# Patient Record
Sex: Female | Born: 1935 | Race: White | Hispanic: No | Smoking: Former smoker
Health system: Southern US, Community
[De-identification: ages and names within clinical notes are randomized; demographics above are authoritative.]

## PROBLEM LIST (undated history)

## (undated) DIAGNOSIS — Z8489 Family history of other specified conditions: Secondary | ICD-10-CM

## (undated) DIAGNOSIS — M1990 Unspecified osteoarthritis, unspecified site: Secondary | ICD-10-CM

## (undated) DIAGNOSIS — M810 Age-related osteoporosis without current pathological fracture: Secondary | ICD-10-CM

## (undated) DIAGNOSIS — G4730 Sleep apnea, unspecified: Secondary | ICD-10-CM

## (undated) DIAGNOSIS — F32A Depression, unspecified: Secondary | ICD-10-CM

## (undated) DIAGNOSIS — N390 Urinary tract infection, site not specified: Secondary | ICD-10-CM

## (undated) DIAGNOSIS — E039 Hypothyroidism, unspecified: Secondary | ICD-10-CM

## (undated) DIAGNOSIS — N183 Chronic kidney disease, stage 3 unspecified: Secondary | ICD-10-CM

## (undated) DIAGNOSIS — F329 Major depressive disorder, single episode, unspecified: Secondary | ICD-10-CM

## (undated) DIAGNOSIS — M549 Dorsalgia, unspecified: Secondary | ICD-10-CM

## (undated) DIAGNOSIS — G8929 Other chronic pain: Secondary | ICD-10-CM

## (undated) DIAGNOSIS — C50911 Malignant neoplasm of unspecified site of right female breast: Secondary | ICD-10-CM

## (undated) DIAGNOSIS — G43909 Migraine, unspecified, not intractable, without status migrainosus: Secondary | ICD-10-CM

## (undated) DIAGNOSIS — M797 Fibromyalgia: Secondary | ICD-10-CM

## (undated) HISTORY — PX: DILATION AND CURETTAGE OF UTERUS: SHX78

## (undated) HISTORY — PX: CHOLECYSTECTOMY OPEN: SUR202

## (undated) HISTORY — PX: BREAST BIOPSY: SHX20

## (undated) HISTORY — PX: TONSILLECTOMY: SUR1361

## (undated) HISTORY — PX: HIP FRACTURE SURGERY: SHX118

## (undated) HISTORY — PX: CATARACT EXTRACTION W/ INTRAOCULAR LENS  IMPLANT, BILATERAL: SHX1307

## (undated) HISTORY — PX: APPENDECTOMY: SHX54

## (undated) HISTORY — PX: JOINT REPLACEMENT: SHX530

## (undated) HISTORY — PX: TOTAL KNEE ARTHROPLASTY: SHX125

## (undated) HISTORY — PX: BREAST LUMPECTOMY: SHX2

---

## 2016-05-07 HISTORY — PX: LACERATION REPAIR: SHX5168

## 2016-10-21 ENCOUNTER — Encounter (HOSPITAL_COMMUNITY): Payer: Self-pay | Admitting: Emergency Medicine

## 2016-10-21 ENCOUNTER — Inpatient Hospital Stay (HOSPITAL_COMMUNITY)
Admission: EM | Admit: 2016-10-21 | Discharge: 2016-10-26 | DRG: 536 | Disposition: A | Discharge status: Skilled Nursing Facility | Payer: Medicare Other | Attending: Internal Medicine | Admitting: Internal Medicine

## 2016-10-21 DIAGNOSIS — S32592A Other specified fracture of left pubis, initial encounter for closed fracture: Principal | ICD-10-CM | POA: Diagnosis present

## 2016-10-21 DIAGNOSIS — R300 Dysuria: Secondary | ICD-10-CM | POA: Diagnosis present

## 2016-10-21 DIAGNOSIS — R296 Repeated falls: Secondary | ICD-10-CM | POA: Diagnosis present

## 2016-10-21 DIAGNOSIS — W010XXA Fall on same level from slipping, tripping and stumbling without subsequent striking against object, initial encounter: Secondary | ICD-10-CM | POA: Diagnosis present

## 2016-10-21 DIAGNOSIS — F329 Major depressive disorder, single episode, unspecified: Secondary | ICD-10-CM | POA: Diagnosis present

## 2016-10-21 DIAGNOSIS — Z853 Personal history of malignant neoplasm of breast: Secondary | ICD-10-CM

## 2016-10-21 DIAGNOSIS — M25552 Pain in left hip: Secondary | ICD-10-CM | POA: Diagnosis not present

## 2016-10-21 DIAGNOSIS — K5900 Constipation, unspecified: Secondary | ICD-10-CM | POA: Diagnosis present

## 2016-10-21 DIAGNOSIS — N183 Chronic kidney disease, stage 3 unspecified: Secondary | ICD-10-CM | POA: Diagnosis present

## 2016-10-21 DIAGNOSIS — M810 Age-related osteoporosis without current pathological fracture: Secondary | ICD-10-CM | POA: Diagnosis present

## 2016-10-21 DIAGNOSIS — S32509A Unspecified fracture of unspecified pubis, initial encounter for closed fracture: Secondary | ICD-10-CM | POA: Diagnosis present

## 2016-10-21 DIAGNOSIS — Z880 Allergy status to penicillin: Secondary | ICD-10-CM

## 2016-10-21 DIAGNOSIS — Z87440 Personal history of urinary (tract) infections: Secondary | ICD-10-CM

## 2016-10-21 DIAGNOSIS — F39 Unspecified mood [affective] disorder: Secondary | ICD-10-CM | POA: Diagnosis present

## 2016-10-21 DIAGNOSIS — E039 Hypothyroidism, unspecified: Secondary | ICD-10-CM | POA: Diagnosis present

## 2016-10-21 DIAGNOSIS — R509 Fever, unspecified: Secondary | ICD-10-CM | POA: Diagnosis present

## 2016-10-21 DIAGNOSIS — Z79899 Other long term (current) drug therapy: Secondary | ICD-10-CM

## 2016-10-21 HISTORY — DX: Other chronic pain: G89.29

## 2016-10-21 HISTORY — DX: Chronic kidney disease, stage 3 unspecified: N18.30

## 2016-10-21 HISTORY — DX: Migraine, unspecified, not intractable, without status migrainosus: G43.909

## 2016-10-21 HISTORY — DX: Dorsalgia, unspecified: M54.9

## 2016-10-21 HISTORY — DX: Malignant neoplasm of unspecified site of right female breast: C50.911

## 2016-10-21 HISTORY — DX: Chronic kidney disease, stage 3 (moderate): N18.3

## 2016-10-21 HISTORY — DX: Sleep apnea, unspecified: G47.30

## 2016-10-21 HISTORY — DX: Unspecified osteoarthritis, unspecified site: M19.90

## 2016-10-21 HISTORY — DX: Urinary tract infection, site not specified: N39.0

## 2016-10-21 HISTORY — DX: Hypothyroidism, unspecified: E03.9

## 2016-10-21 HISTORY — DX: Family history of other specified conditions: Z84.89

## 2016-10-21 HISTORY — DX: Fibromyalgia: M79.7

## 2016-10-21 HISTORY — DX: Depression, unspecified: F32.A

## 2016-10-21 HISTORY — DX: Major depressive disorder, single episode, unspecified: F32.9

## 2016-10-21 HISTORY — DX: Age-related osteoporosis without current pathological fracture: M81.0

## 2016-10-21 NOTE — ED Provider Notes (Signed)
[facility] DEPT Provider Note   CSN: [id number] Arrival date & time: 10/21/16  2340  By signing my name below, I, Oleh Genin, attest that this documentation has been prepared under the direction and in the presence of Orpah Greek, MD. Electronically Signed: Oleh Genin, Scribe. 10/21/16. 11:44 PM.    History   Chief Complaint Chief Complaint  Patient presents with  . Fall    HPI Rhonda Snyder is a 83 y.o. female who presents to the ED following a fall from standing that occurred this evening. This patient states that she was trying to take off a dress, lost balance, and fell backwards to the floor. She struck her head on the floor. However no LOC. At interview, the patient is reporting mild posterior head pain, posterior neck pain, and L posterior hip/"buttocks" area. The patient did have a bilateral hip "procedure" in the past. Denies any changes in vision, decreased sensation, or loss of function distally. She has no other complaints at this time.   The history is provided by the patient. No language interpreter was used.    History reviewed. No pertinent past medical history.  There are no active problems to display for this patient.   History reviewed. No pertinent surgical history.  OB History    No data available       Home Medications    Prior to Admission medications   Medication Sig Start Date End Date Taking? Authorizing Provider  acetaminophen (TYLENOL) 500 MG chewable tablet Chew 500 mg by mouth every evening.   Yes Historical Provider, MD  buPROPion (WELLBUTRIN XL) 300 MG 24 hr tablet Take 300 mg by mouth daily.   Yes Historical Provider, MD  Calcium-Magnesium-Vitamin D (CALCIUM 1200+D3 PO) Take 1 tablet by mouth 2 (two) times daily.   Yes Historical Provider, MD  escitalopram (LEXAPRO) 10 MG tablet Take 10 mg by mouth daily.   Yes Historical Provider, MD  exemestane (AROMASIN) 25 MG tablet Take 25 mg by mouth every evening.   Yes  Historical Provider, MD  HYDROcodone-acetaminophen (NORCO/VICODIN) 5-325 MG tablet Take 1 tablet by mouth every 6 (six) hours as needed for moderate pain.   Yes Historical Provider, MD  levothyroxine (SYNTHROID, LEVOTHROID) 137 MCG tablet Take 137 mcg by mouth daily before breakfast.   Yes Historical Provider, MD  Multiple Vitamin (MULTIVITAMIN WITH MINERALS) TABS tablet Take 1 tablet by mouth daily.   Yes Historical Provider, MD  nitrofurantoin (MACRODANTIN) 50 MG capsule Take 50 mg by mouth at bedtime.   Yes Historical Provider, MD    Family History History reviewed. No pertinent family history.  Social History Social History  Substance Use Topics  . Smoking status: Never Smoker  . Smokeless tobacco: Never Used  . Alcohol use 1.8 oz/week    3 Glasses of wine per week     Comment: per week     Allergies   Penicillins   Review of Systems Review of Systems  Musculoskeletal: Positive for neck pain.       L hip pain  Neurological: Positive for headaches. Negative for weakness and numbness.  All other systems reviewed and are negative.    Physical Exam Updated Vital Signs BP 145/71   Pulse 90   Temp 98.4 F (36.9 C) (Oral)   Resp 13   Ht 5\' 7"  (1.702 m)   Wt 155 lb (70.3 kg)   SpO2 96%   BMI 24.28 kg/m   Physical Exam  Constitutional: She is oriented to person, place,  and time. She appears well-developed and well-nourished. No distress.  HENT:  Head: Normocephalic.  Right Ear: Hearing normal.  Left Ear: Hearing normal.  Nose: Nose normal.  Mouth/Throat: Oropharynx is clear and moist and mucous membranes are normal.  There is a contusion to the R occipital scalp.   Eyes: Conjunctivae and EOM are normal. Pupils are equal, round, and reactive to light.  Neck: Normal range of motion. Neck supple.  There is para-cervical tenderness bilaterally.   Cardiovascular: Regular rhythm, S1 normal and S2 normal.  Exam reveals no gallop and no friction rub.   No murmur  heard. Pulmonary/Chest: Effort normal and breath sounds normal. No respiratory distress. She exhibits no tenderness.  Abdominal: Soft. Normal appearance and bowel sounds are normal. There is no hepatosplenomegaly. There is no tenderness. There is no rebound, no guarding, no tenderness at McBurney's point and negative Murphy's sign. No hernia.  Musculoskeletal:  There is decreased ROM of the L hip secondary to pain without obvious deformity.   Neurological: She is alert and oriented to person, place, and time. She has normal strength. No cranial nerve deficit or sensory deficit. Coordination normal. GCS eye subscore is 4. GCS verbal subscore is 5. GCS motor subscore is 6.  Skin: Skin is warm, dry and intact. No rash noted. No cyanosis.  Psychiatric: She has a normal mood and affect. Her speech is normal and behavior is normal. Thought content normal.  Nursing note and vitals reviewed.    ED Treatments / Results  DIAGNOSTIC STUDIES: Oxygen Saturation is 97 percent on room air which is normal by my interpretation.    COORDINATION OF CARE: 11:51 PM Discussed treatment plan with pt at bedside and pt agreed to plan.    Labs (all labs ordered are listed, but only abnormal results are displayed) Labs Reviewed  CBC WITH DIFFERENTIAL/PLATELET - Abnormal; Notable for the following:       Result Value   MCV 101.0 (*)    Neutro Abs 7.8 (*)    All other components within normal limits  BASIC METABOLIC PANEL - Abnormal; Notable for the following:    Creatinine, Ser 1.11 (*)    GFR calc non Af Amer 45 (*)    GFR calc Af Amer 52 (*)    All other components within normal limits    EKG  EKG Interpretation None       Radiology Ct Head Wo Contrast  Result Date: 10/22/2016 CLINICAL DATA:  Post unwitnessed fall today with headache. EXAM: CT HEAD WITHOUT CONTRAST CT CERVICAL SPINE WITHOUT CONTRAST TECHNIQUE: Multidetector CT imaging of the head and cervical spine was performed following the  standard protocol without intravenous contrast. Multiplanar CT image reconstructions of the cervical spine were also generated. COMPARISON:  None. FINDINGS: CT HEAD FINDINGS Brain: No evidence of acute infarction, hemorrhage, hydrocephalus, extra-axial collection or mass lesion/mass effect. Moderate generalized atrophy. Mild chronic small vessel ischemia. Vascular: No hyperdense vessel or unexpected calcification. Skull: Normal. Negative for fracture or focal lesion. Sinuses/Orbits: Bilateral cataract resection. Orbits are otherwise unremarkable. Paranasal sinuses and mastoid air cells well-aerated. Other: None. CT CERVICAL SPINE FINDINGS Alignment: Minimal degenerative-type anterolisthesis of C4 on C5. Alignment is otherwise maintained. Skull base and vertebrae: No acute fracture. No primary bone lesion or focal pathologic process. Chronic degenerative change at C1-C2 articulation. Soft tissues and spinal canal: No prevertebral fluid or swelling. No visible canal hematoma. Disc levels: Disc space narrowing and endplate spurring most prominent at C5-C6 and C6-C7. There is multifocal facet arthropathy.  Multilevel neural foraminal stenosis. Upper chest: No acute abnormality. Mild biapical pleuroparenchymal scarring. Other: Carotid vascular calcifications, left greater than right. IMPRESSION: 1. No acute intracranial abnormality or skull fracture. Generalized atrophy and chronic small vessel ischemia. 2. Degenerative change in the cervical spine without acute fracture or subluxation. Electronically Signed   By: Jeb Levering M.D.   On: 10/22/2016 00:34   Ct Cervical Spine Wo Contrast  Result Date: 10/22/2016 CLINICAL DATA:  Post unwitnessed fall today with headache. EXAM: CT HEAD WITHOUT CONTRAST CT CERVICAL SPINE WITHOUT CONTRAST TECHNIQUE: Multidetector CT imaging of the head and cervical spine was performed following the standard protocol without intravenous contrast. Multiplanar CT image reconstructions of  the cervical spine were also generated. COMPARISON:  None. FINDINGS: CT HEAD FINDINGS Brain: No evidence of acute infarction, hemorrhage, hydrocephalus, extra-axial collection or mass lesion/mass effect. Moderate generalized atrophy. Mild chronic small vessel ischemia. Vascular: No hyperdense vessel or unexpected calcification. Skull: Normal. Negative for fracture or focal lesion. Sinuses/Orbits: Bilateral cataract resection. Orbits are otherwise unremarkable. Paranasal sinuses and mastoid air cells well-aerated. Other: None. CT CERVICAL SPINE FINDINGS Alignment: Minimal degenerative-type anterolisthesis of C4 on C5. Alignment is otherwise maintained. Skull base and vertebrae: No acute fracture. No primary bone lesion or focal pathologic process. Chronic degenerative change at C1-C2 articulation. Soft tissues and spinal canal: No prevertebral fluid or swelling. No visible canal hematoma. Disc levels: Disc space narrowing and endplate spurring most prominent at C5-C6 and C6-C7. There is multifocal facet arthropathy. Multilevel neural foraminal stenosis. Upper chest: No acute abnormality. Mild biapical pleuroparenchymal scarring. Other: Carotid vascular calcifications, left greater than right. IMPRESSION: 1. No acute intracranial abnormality or skull fracture. Generalized atrophy and chronic small vessel ischemia. 2. Degenerative change in the cervical spine without acute fracture or subluxation. Electronically Signed   By: Jeb Levering M.D.   On: 10/22/2016 00:34   Dg Hip Unilat W Or Wo Pelvis 2-3 Views Left  Result Date: 10/22/2016 CLINICAL DATA:  Fall today with left hip/ groin pain. EXAM: DG HIP (WITH OR WITHOUT PELVIS) 2-3V LEFT COMPARISON:  None. FINDINGS: Intramedullary rod with proximal screw fixation of both hips. The hardware is intact. No periprosthetic fracture. Both femoral heads appear seated in the acetabulum. Cortical irregularity of the left superior pubic ramus adjacent to the pubic body.  Probable nondisplaced left inferior pubic ramus fracture. Pubic symphysis remains congruent. The bones are under mineralized. IMPRESSION: Suspect acute nondisplaced left superior and inferior pubic rami fractures. Electronically Signed   By: Jeb Levering M.D.   On: 10/22/2016 00:20    Procedures Procedures (including critical care time)  Medications Ordered in ED Medications  morphine 4 MG/ML injection 2 mg (2 mg Intravenous Incomplete 10/22/16 0354)  ondansetron (ZOFRAN) injection 4 mg (4 mg Intravenous Incomplete 10/22/16 0355)  oxyCODONE-acetaminophen (PERCOCET/ROXICET) 5-325 MG per tablet 1 tablet (1 tablet Oral Given 10/22/16 0221)     Initial Impression / Assessment and Plan / ED Course  I have reviewed the triage vital signs and the nursing notes.  Pertinent labs & imaging results that were available during my care of the patient were reviewed by me and considered in my medical decision making (see chart for details).  Clinical Course    Patient presents to the emergency department with complaints of left hip pain after a mechanical fall. Patient reports that she normally sits down to change her clothing, try to take her shirt off while standing and lost her balance. She has been experiencing pain behind her left hip  ever since the fall. X-ray of left hip reveals no evidence of hip fracture or dislocation, however, there is evidence of superior and inferior pubic rami fracture that explains her current pain. No evidence of head injury.  Patient has been administered analgesia here in the ER with only minimal improvement of her pain. She is unable to sit up in the bed, cannot ambulate at all. Will require hospitalization for further pain management and physical therapy.  Final Clinical Impressions(s) / ED Diagnoses   Final diagnoses:  Closed fracture of multiple pubic rami, left, initial encounter [facility])    New Prescriptions New Prescriptions   No medications on file  I  personally performed the services described in this documentation, which was scribed in my presence. The recorded information has been reviewed and is accurate.     Orpah Greek, MD 10/22/16 [phone]

## 2016-10-21 NOTE — ED Triage Notes (Signed)
Per EMS: Pt from EMS. Pt had an unwitnessed fall. generalized hip/sacral, left leg pain, HA. No blood thinners. Did not lose consciousness. Pt did hit her head. Neuro statues was good en-route.

## 2016-10-22 ENCOUNTER — Encounter (HOSPITAL_COMMUNITY): Payer: Self-pay | Admitting: Family Medicine

## 2016-10-22 ENCOUNTER — Emergency Department (HOSPITAL_COMMUNITY): Payer: Medicare Other

## 2016-10-22 DIAGNOSIS — S32509A Unspecified fracture of unspecified pubis, initial encounter for closed fracture: Secondary | ICD-10-CM | POA: Diagnosis not present

## 2016-10-22 DIAGNOSIS — N183 Chronic kidney disease, stage 3 unspecified: Secondary | ICD-10-CM | POA: Diagnosis present

## 2016-10-22 DIAGNOSIS — E039 Hypothyroidism, unspecified: Secondary | ICD-10-CM | POA: Diagnosis present

## 2016-10-22 DIAGNOSIS — R300 Dysuria: Secondary | ICD-10-CM | POA: Diagnosis present

## 2016-10-22 LAB — CBC WITH DIFFERENTIAL/PLATELET
BASOS ABS: 0 10*3/uL (ref 0.0–0.1)
BASOS PCT: 0 %
EOS ABS: 0.2 10*3/uL (ref 0.0–0.7)
EOS PCT: 2 %
HCT: 39.5 % (ref 36.0–46.0)
Hemoglobin: 12.9 g/dL (ref 12.0–15.0)
Lymphocytes Relative: 8 %
Lymphs Abs: 0.7 10*3/uL (ref 0.7–4.0)
MCH: 33 pg (ref 26.0–34.0)
MCHC: 32.7 g/dL (ref 30.0–36.0)
MCV: 101 fL — AB (ref 78.0–100.0)
MONO ABS: 0.3 10*3/uL (ref 0.1–1.0)
Monocytes Relative: 4 %
Neutro Abs: 7.8 10*3/uL — ABNORMAL HIGH (ref 1.7–7.7)
Neutrophils Relative %: 86 %
PLATELETS: 209 10*3/uL (ref 150–400)
RBC: 3.91 MIL/uL (ref 3.87–5.11)
RDW: 13.4 % (ref 11.5–15.5)
WBC: 9 10*3/uL (ref 4.0–10.5)

## 2016-10-22 LAB — BASIC METABOLIC PANEL
ANION GAP: 12 (ref 5–15)
BUN: 20 mg/dL (ref 6–20)
CALCIUM: 9.4 mg/dL (ref 8.9–10.3)
CO2: 25 mmol/L (ref 22–32)
Chloride: 104 mmol/L (ref 101–111)
Creatinine, Ser: 1.11 mg/dL — ABNORMAL HIGH (ref 0.44–1.00)
GFR calc Af Amer: 52 mL/min — ABNORMAL LOW (ref >60)
GFR, EST NON AFRICAN AMERICAN: 45 mL/min — AB (ref >60)
GLUCOSE: 97 mg/dL (ref 65–99)
Potassium: 3.6 mmol/L (ref 3.5–5.1)
SODIUM: 141 mmol/L (ref 135–145)

## 2016-10-22 MED ORDER — LEVOTHYROXINE SODIUM 25 MCG PO TABS
137.0000 ug | ORAL_TABLET | Freq: Every day | ORAL | Status: DC
  Administered 2016-10-22 – 2016-10-26 (×5): 137 ug via ORAL
  Filled 2016-10-22 (×5): qty 1

## 2016-10-22 MED ORDER — ONDANSETRON HCL 4 MG/2ML IJ SOLN
4.0000 mg | Freq: Once | INTRAMUSCULAR | Status: AC
  Administered 2016-10-22: 4 mg via INTRAVENOUS
  Filled 2016-10-22: qty 2

## 2016-10-22 MED ORDER — BUPROPION HCL ER (XL) 150 MG PO TB24
300.0000 mg | ORAL_TABLET | Freq: Every day | ORAL | Status: DC
  Administered 2016-10-22 – 2016-10-26 (×5): 300 mg via ORAL
  Filled 2016-10-22 (×5): qty 2

## 2016-10-22 MED ORDER — OXYCODONE-ACETAMINOPHEN 5-325 MG PO TABS
1.0000 | ORAL_TABLET | Freq: Once | ORAL | Status: AC
  Administered 2016-10-22: 1 via ORAL
  Filled 2016-10-22: qty 1

## 2016-10-22 MED ORDER — ENOXAPARIN SODIUM 40 MG/0.4ML ~~LOC~~ SOLN
40.0000 mg | SUBCUTANEOUS | Status: DC
  Administered 2016-10-22 – 2016-10-26 (×5): 40 mg via SUBCUTANEOUS
  Filled 2016-10-22 (×5): qty 0.4

## 2016-10-22 MED ORDER — ACETAMINOPHEN 500 MG PO TABS
1000.0000 mg | ORAL_TABLET | Freq: Three times a day (TID) | ORAL | Status: DC
  Administered 2016-10-22 – 2016-10-26 (×12): 1000 mg via ORAL
  Filled 2016-10-22 (×13): qty 2

## 2016-10-22 MED ORDER — ENSURE ENLIVE PO LIQD
237.0000 mL | Freq: Two times a day (BID) | ORAL | Status: DC
  Administered 2016-10-22: 237 mL via ORAL

## 2016-10-22 MED ORDER — NITROFURANTOIN MACROCRYSTAL 50 MG PO CAPS
50.0000 mg | ORAL_CAPSULE | Freq: Every day | ORAL | Status: DC
  Administered 2016-10-22 – 2016-10-23 (×2): 50 mg via ORAL
  Filled 2016-10-22 (×2): qty 1

## 2016-10-22 MED ORDER — OXYCODONE HCL 5 MG PO TABS
5.0000 mg | ORAL_TABLET | ORAL | Status: DC | PRN
  Administered 2016-10-22 – 2016-10-26 (×14): 5 mg via ORAL
  Filled 2016-10-22 (×15): qty 1

## 2016-10-22 MED ORDER — METHOCARBAMOL 750 MG PO TABS
750.0000 mg | ORAL_TABLET | Freq: Three times a day (TID) | ORAL | Status: DC
  Administered 2016-10-22 – 2016-10-23 (×3): 750 mg via ORAL
  Filled 2016-10-22 (×3): qty 1

## 2016-10-22 MED ORDER — ESCITALOPRAM OXALATE 10 MG PO TABS
10.0000 mg | ORAL_TABLET | Freq: Every day | ORAL | Status: DC
  Administered 2016-10-22 – 2016-10-26 (×5): 10 mg via ORAL
  Filled 2016-10-22 (×5): qty 1

## 2016-10-22 MED ORDER — MORPHINE SULFATE (PF) 4 MG/ML IV SOLN
2.0000 mg | Freq: Once | INTRAVENOUS | Status: AC
  Administered 2016-10-22: 2 mg via INTRAVENOUS
  Filled 2016-10-22: qty 1

## 2016-10-22 MED ORDER — MORPHINE SULFATE (PF) 4 MG/ML IV SOLN
4.0000 mg | INTRAVENOUS | Status: DC | PRN
  Administered 2016-10-22: 4 mg via INTRAVENOUS
  Filled 2016-10-22: qty 1

## 2016-10-22 MED ORDER — SENNOSIDES-DOCUSATE SODIUM 8.6-50 MG PO TABS: 1.0000 | ORAL_TABLET | Freq: Every evening | ORAL | Status: DC | PRN

## 2016-10-22 NOTE — Evaluation (Signed)
Physical Therapy Evaluation Patient Details Name: Rhonda Snyder MRN: [id number] DOB: 04/01/1931 Today's Date: 10/22/2016   History of Present Illness  Pt is a 83 y.o. female with a past medical history significant for osteoporosis, recurrent UTI, hypothyroidism, and recurrent falls who presents with fall and hip pain. Pt is visiting her daughter from [place]. Admitted for pubic fracture.   Clinical Impression  PTA, pt required assist with ADLs (bathing, cooking, cleaning) and ambulated using standard walker and rollator. Pt is from [place] but will be staying with her daughter and her daughter's wife in [place] until she is well enough to fly back home. Pt daughter works full time and her daughter's wife is caring for 4 children. Pt limited by high pain and fatigue this session (LLE pain>RLE pain). Required mod A +2 for sit<> stand but was able to take small steps to chair with +1 mod A. Good UE strength but required cues to offload BLEs using RW. Recommending SNF at this time to address deficits in mobility and to reduce caregiver burden. PT will continue to follow acutely. Called pt's daughter, [name], after session to confirm availability of standard walker and rollator at home. Reviewed PT recommendation for SNF or HHPT,HHOT, and HH Aide if SNF is not possible. [name] agreed that it would be difficult to care for pt at home as Linda's wife is also physically disabled and has to care for 4 children at home. [name] reports possibility of also hiring extra assistance at home.    Follow Up Recommendations SNF (HHPT, HHOT, [place] if insurance does not cover SNF stay)    Equipment Recommendations  Other (comment);Wheelchair (measurements PT);Wheelchair cushion (measurements PT);3in1 (PT) (18x18, antitippers, desk arms, elevating leg rests) pressure relieving cushion   Recommendations for Other Services       Precautions / Restrictions Precautions Precautions: Fall Restrictions Weight  Bearing Restrictions: No      Mobility  Bed Mobility Overal bed mobility: Needs Assistance Bed Mobility: Rolling;Sidelying to Sit Rolling: Min guard Sidelying to sit: Mod assist;+2 for physical assistance       General bed mobility comments: Pt with good UE strength to roll although painful. Requires Mod A for LE mobility and trunk upright. use of bed pad to slide pelvis. VCs for hand placement and sequencing  Transfers Overall transfer level: Needs assistance Equipment used: Rolling walker (2 wheeled) Transfers: Sit to/from [facility] Sit to Stand: Mod assist;+2 physical assistance Stand pivot transfers: Mod assist       General transfer comment: Mod A +2 to power to stand and maintain standing balance. Pt initially demonstrates significant forward lean but is able to correct with time and VCs. Pt able to take small steps to pivot to chair. Cues for use of UE to offload LEs and for trunk upright.   Ambulation/Gait                Stairs            Wheelchair Mobility    Modified Rankin (Stroke Patients Only)       Balance Overall balance assessment: Needs assistance Sitting-balance support: Bilateral upper extremity supported;Feet supported Sitting balance-Leahy Scale: Poor Sitting balance - Comments: Requires BUE support while sitting EOB   Standing balance support: Bilateral upper extremity supported;During functional activity Standing balance-Leahy Scale: Poor Standing balance comment: Reliant on RW for standing balance  Pertinent Vitals/Pain Pain Assessment: 0-10 Pain Score: 4  Pain Location: L hip Pain Descriptors / Indicators: Sharp Pain Intervention(s): Limited activity within patient's tolerance;Monitored during session;Repositioned    Home Living Family/patient expects to be discharged to:: Private residence Living Arrangements: Children (daughter; daughter's wife be present 24 hr.  with 2 children) Available Help at Discharge: Family;Available 24 hours/day Type of Home: House Home Access: Level entry     Home Layout: Multi-level;Able to live on main level with bedroom/bathroom Home Equipment: Walker - 4 wheels;Walker - standard;Shower seat;Bedside commode      Prior Function Level of Independence: Independent with assistive device(s)         Comments: assistance with bathing, cleaning, cooking     Hand Dominance   Dominant Hand: Right    Extremity/Trunk Assessment   Upper Extremity Assessment Upper Extremity Assessment: Overall WFL for tasks assessed    Lower Extremity Assessment Lower Extremity Assessment: Generalized weakness;LLE deficits/detail LLE Deficits / Details: Limited ROM and strength 2/2 high pain. Painful to touch       Communication   Communication: No difficulties  Cognition Arousal/Alertness: Lethargic Behavior During Therapy: WFL for tasks assessed/performed Overall Cognitive Status: Within Functional Limits for tasks assessed                      General Comments      Exercises General Exercises - Lower Extremity Ankle Circles/Pumps: AROM;Both;10 reps;Supine   Assessment/Plan    PT Assessment Patient needs continued PT services  PT Problem List Decreased strength;Decreased range of motion;Decreased activity tolerance;Decreased balance;Decreased mobility;Decreased knowledge of use of DME;Decreased safety awareness;Decreased knowledge of precautions;Pain          PT Treatment Interventions DME instruction;Gait training;Functional mobility training;Therapeutic activities;Therapeutic exercise;Balance training;Patient/family education    PT Goals (Current goals can be found in the Care Plan section)  Acute Rehab PT Goals Patient Stated Goal: to go home and be stronger PT Goal Formulation: With patient Time For Goal Achievement: 11/05/16 Potential to Achieve Goals: Fair    Frequency Min 5X/week   Barriers  to discharge Decreased caregiver support Pt's daughter with full time job. Daughter's wife will be caring for 4 children and also has her own physical disabilities    Co-evaluation               End of Session Equipment Utilized During Treatment: Gait belt Activity Tolerance: Patient tolerated treatment well Patient left: in chair;with call bell/phone within reach Nurse Communication: Mobility status    Functional Assessment Tool Used: clinical judgment Functional Limitation: Mobility: Walking and moving around Mobility: Walking and Moving Around Current Status [id number]): At least 40 percent but less than 60 percent impaired, limited or restricted Mobility: Walking and Moving Around Goal Status [phone]): At least 1 percent but less than 20 percent impaired, limited or restricted    Time: 1013-1056 PT Time Calculation (min) (ACUTE ONLY): 43 min   Charges:   PT Evaluation $PT Eval Moderate Complexity: 1 Procedure PT Treatments $Therapeutic Activity: 8-22 mins $Self Care/Home Management: 8-22   PT G Codes:   PT G-Codes **NOT FOR INPATIENT CLASS** Functional Assessment Tool Used: clinical judgment Functional Limitation: Mobility: Walking and moving around Mobility: Walking and Moving Around Current Status [id number]): At least 40 percent but less than 60 percent impaired, limited or restricted Mobility: Walking and Moving Around Goal Status [phone]): At least 1 percent but less than 20 percent impaired, limited or restricted    Tonia Brooms 10/22/2016, 1:03 PM Tonia Brooms,  SPT [phone]

## 2016-10-22 NOTE — ED Notes (Signed)
Georgina Snell, RN tried an IV once and Event organiser, RN tried twice with no success. IV team consult placed

## 2016-10-22 NOTE — Progress Notes (Signed)
[facility] Interval Note   Patient seen and examined - Patient admitted after midnight, see H&P for full details  Non displace hip fracture of the pubic ramus - c/o pain in the hip, no other concern  Pain control, PT/OT - recommended SNF  Will add Robaxin, will consult SW for placement   Chipper Oman, MD

## 2016-10-22 NOTE — ED Notes (Signed)
Izora Gala, RN on 6N took report, but bed is not ready yet. Izora Gala states that next shift will call the ED when the bed is ready.

## 2016-10-22 NOTE — ED Notes (Signed)
Pt put on 2L of O2 for a saturation of 90-91 consistently. Pt is comfortable and mental status is still WNL.

## 2016-10-22 NOTE — H&P (Signed)
History and Physical  Patient Name: Rhonda Snyder     [id number]    DOB: 01-03-1931    DOA: 10/21/2016 PCP: No PCP Per Patient   Patient coming from: Daughter's home  Chief Complaint: Fall, hip pain  HPI: Rhonda Snyder is a 83 y.o. female with a past medical history significant for osteoporosis, recurrent UTI, hypothyroidism, and recurrent falls who presents with fall and hip pain.  The patient is visiting her daughter from [place] and was supposed to fly back today (Tuesday). She was in her usual state of health until tonight, when she was changing for bed, had her shirt over her head, lost her balance and fell. There was no loss of consciousness, dizziness, chest pressure, palpitations, or presyncope.  Afterwards, she had some left hip and pelvis pain and had difficulty walking, so her daughter brought her to the ER.  ED course: -Afebrile, heart rate 88, respirations and pulse ox normal, blood pressure 152/74 -Na 141, K 3.6, Cr 1.11 (baseline 1.1), WBC 9K, Hgb 12.9 and macrocytic -Radiograph of the left hip showed possible pubic rami fractures, nondisplaced line -CT of head and C-spine were unremarkable -She was given oral opioid without any improvement in her pain, followed by IV morphine with moderate improvement in pain, but was still unable to walk, so TRH were asked to observe for PT eval   The patient lives in [place], alone, has an aide who comes 5 nights a week, and a housekeeper. She has osteoporosis, and numerous falls resulting in fractures (fractured her left hip in 2015, her right hip in 2017). Her most recent fall was in June of last year, requiring 3 day stay.  Of note, she also describes new onset of dysuria and low back pain, typical or her urinary tract infections.    ROS: Review of Systems  Genitourinary: Positive for dysuria.  Musculoskeletal: Positive for falls and joint pain.  All other systems reviewed and are negative.         Past Medical  History:  Diagnosis Date  . Breast cancer ([place])   . Chronic kidney disease, stage 3   . Depression   . Hypothyroidism   . Osteoporosis   . Recurrent UTI     Past Surgical History:  Procedure Laterality Date  . CHOLECYSTECTOMY    . ORIF HIP FRACTURE    . TOTAL KNEE ARTHROPLASTY      Social History: Patient lives alone.  The patient walks unassisted or with a walker.  She is from [place], [place].  She worked as a Education officer, museum.  Her daughter lives in [place], she has two other children.    Allergies  Allergen Reactions  . Penicillins Rash    Family history: family history includes Chronic bronchitis in her mother; Liver cancer in her father.  Prior to Admission medications   Medication Sig Start Date End Date Taking? Authorizing Provider  acetaminophen (TYLENOL) 500 MG chewable tablet Chew 500 mg by mouth every evening.   Yes Historical Provider, MD  buPROPion (WELLBUTRIN XL) 300 MG 24 hr tablet Take 300 mg by mouth daily.   Yes Historical Provider, MD  Calcium-Magnesium-Vitamin D (CALCIUM 1200+D3 PO) Take 1 tablet by mouth 2 (two) times daily.   Yes Historical Provider, MD  escitalopram (LEXAPRO) 10 MG tablet Take 10 mg by mouth daily.   Yes Historical Provider, MD  exemestane (AROMASIN) 25 MG tablet Take 25 mg by mouth every evening.   Yes Historical Provider, MD  HYDROcodone-acetaminophen (NORCO/VICODIN) 5-325 MG tablet  Take 1 tablet by mouth every 6 (six) hours as needed for moderate pain.   Yes Historical Provider, MD  levothyroxine (SYNTHROID, LEVOTHROID) 137 MCG tablet Take 137 mcg by mouth daily before breakfast.   Yes Historical Provider, MD  Multiple Vitamin (MULTIVITAMIN WITH MINERALS) TABS tablet Take 1 tablet by mouth daily.   Yes Historical Provider, MD  nitrofurantoin (MACRODANTIN) 50 MG capsule Take 50 mg by mouth at bedtime.   Yes Historical Provider, MD       Physical Exam: BP 109/62   Pulse 90   Temp 98.4 F (36.9 C) (Oral)   Resp 15   Ht 5\' 7"   (1.702 m)   Wt 70.3 kg (155 lb)   SpO2 96%   BMI 24.28 kg/m  General appearance: Thin elderly adult female, alert and in no acute distress.   Eyes: Anicteric, conjunctiva pink, lids and lashes normal. PERRL.    ENT: No nasal deformity, discharge, epistaxis.  Hearing normal. OP moist without lesions.   Neck: No neck masses.  Trachea midline.  No thyromegaly/tenderness. Lymph: No cervical or supraclavicular lymphadenopathy. Skin: Warm and dry.  No jaundice.  No suspicious rashes or lesions. Cardiac: RRR, nl S1-S2, no murmurs appreciated.  Capillary refill is brisk.  JVP normal.  No LE edema.  Radial and DP pulses 2+ and symmetric. Respiratory: Normal respiratory rate and rhythm.  CTAB without rales or wheezes. Abdomen: Abdomen soft.  No TTP. No ascites, distension, hepatosplenomegaly.   MSK: No deformities or effusions.  No cyanosis or clubbing.  No eversion of left leg.  Neuro: Cranial nerves 3-12 grossly intact.  Sensation intact to light touch. Speech is fluent.  Muscle strength 5/5 in upper extremities bilaterally, and right leg.  Left leg flexion is limited by pain.    Psych: Sensorium intact and responding to questions, attention normal.  Behavior appropriate.  Affect normal.  Judgment and insight appear normal.     Labs on Admission:  I have personally reviewed following labs and imaging studies: CBC:  Recent Labs Lab 10/22/16 0042  WBC 9.0  NEUTROABS 7.8*  HGB 12.9  HCT 39.5  MCV 101.0*  PLT [id number]   Basic Metabolic Panel:  Recent Labs Lab 10/22/16 0042  NA 141  K 3.6  CL 104  CO2 25  GLUCOSE 97  BUN 20  CREATININE 1.11*  CALCIUM 9.4   GFR: Estimated Creatinine Clearance: 37.3 mL/min (by C-G formula based on SCr of 1.11 mg/dL (H)).  Liver Function Tests: No results for input(s): AST, ALT, ALKPHOS, BILITOT, PROT, ALBUMIN in the last 168 hours. No results for input(s): LIPASE, AMYLASE in the last 168 hours. No results for input(s): AMMONIA in the last 168  hours. Coagulation Profile: No results for input(s): INR, PROTIME in the last 168 hours. Cardiac Enzymes: No results for input(s): CKTOTAL, CKMB, CKMBINDEX, TROPONINI in the last 168 hours. BNP (last 3 results) No results for input(s): PROBNP in the last 8760 hours. HbA1C: No results for input(s): HGBA1C in the last 72 hours. CBG: No results for input(s): GLUCAP in the last 168 hours. Lipid Profile: No results for input(s): CHOL, HDL, LDLCALC, TRIG, CHOLHDL, LDLDIRECT in the last 72 hours. Thyroid Function Tests: No results for input(s): TSH, T4TOTAL, FREET4, T3FREE, THYROIDAB in the last 72 hours. Anemia Panel: No results for input(s): VITAMINB12, FOLATE, FERRITIN, TIBC, IRON, RETICCTPCT in the last 72 hours. Sepsis Labs: Invalid input(s): PROCALCITONIN, LACTICIDVEN No results found for this or any previous visit (from the past 240 hour(s)).  Radiological Exams on Admission: Personally reviewed CT and radiograph reports: Ct Head Wo Contrast  Result Date: 10/22/2016 CLINICAL DATA:  Post unwitnessed fall today with headache. EXAM: CT HEAD WITHOUT CONTRAST CT CERVICAL SPINE WITHOUT CONTRAST TECHNIQUE: Multidetector CT imaging of the head and cervical spine was performed following the standard protocol without intravenous contrast. Multiplanar CT image reconstructions of the cervical spine were also generated. COMPARISON:  None. FINDINGS: CT HEAD FINDINGS Brain: No evidence of acute infarction, hemorrhage, hydrocephalus, extra-axial collection or mass lesion/mass effect. Moderate generalized atrophy. Mild chronic small vessel ischemia. Vascular: No hyperdense vessel or unexpected calcification. Skull: Normal. Negative for fracture or focal lesion. Sinuses/Orbits: Bilateral cataract resection. Orbits are otherwise unremarkable. Paranasal sinuses and mastoid air cells well-aerated. Other: None. CT CERVICAL SPINE FINDINGS Alignment: Minimal degenerative-type anterolisthesis of C4 on C5.  Alignment is otherwise maintained. Skull base and vertebrae: No acute fracture. No primary bone lesion or focal pathologic process. Chronic degenerative change at C1-C2 articulation. Soft tissues and spinal canal: No prevertebral fluid or swelling. No visible canal hematoma. Disc levels: Disc space narrowing and endplate spurring most prominent at C5-C6 and C6-C7. There is multifocal facet arthropathy. Multilevel neural foraminal stenosis. Upper chest: No acute abnormality. Mild biapical pleuroparenchymal scarring. Other: Carotid vascular calcifications, left greater than right. IMPRESSION: 1. No acute intracranial abnormality or skull fracture. Generalized atrophy and chronic small vessel ischemia. 2. Degenerative change in the cervical spine without acute fracture or subluxation. Electronically Signed   By: Jeb Levering M.D.   On: 10/22/2016 00:34   Ct Cervical Spine Wo Contrast  Result Date: 10/22/2016 CLINICAL DATA:  Post unwitnessed fall today with headache. EXAM: CT HEAD WITHOUT CONTRAST CT CERVICAL SPINE WITHOUT CONTRAST TECHNIQUE: Multidetector CT imaging of the head and cervical spine was performed following the standard protocol without intravenous contrast. Multiplanar CT image reconstructions of the cervical spine were also generated. COMPARISON:  None. FINDINGS: CT HEAD FINDINGS Brain: No evidence of acute infarction, hemorrhage, hydrocephalus, extra-axial collection or mass lesion/mass effect. Moderate generalized atrophy. Mild chronic small vessel ischemia. Vascular: No hyperdense vessel or unexpected calcification. Skull: Normal. Negative for fracture or focal lesion. Sinuses/Orbits: Bilateral cataract resection. Orbits are otherwise unremarkable. Paranasal sinuses and mastoid air cells well-aerated. Other: None. CT CERVICAL SPINE FINDINGS Alignment: Minimal degenerative-type anterolisthesis of C4 on C5. Alignment is otherwise maintained. Skull base and vertebrae: No acute fracture. No  primary bone lesion or focal pathologic process. Chronic degenerative change at C1-C2 articulation. Soft tissues and spinal canal: No prevertebral fluid or swelling. No visible canal hematoma. Disc levels: Disc space narrowing and endplate spurring most prominent at C5-C6 and C6-C7. There is multifocal facet arthropathy. Multilevel neural foraminal stenosis. Upper chest: No acute abnormality. Mild biapical pleuroparenchymal scarring. Other: Carotid vascular calcifications, left greater than right. IMPRESSION: 1. No acute intracranial abnormality or skull fracture. Generalized atrophy and chronic small vessel ischemia. 2. Degenerative change in the cervical spine without acute fracture or subluxation. Electronically Signed   By: Jeb Levering M.D.   On: 10/22/2016 00:34   Dg Hip Unilat W Or Wo Pelvis 2-3 Views Left  Result Date: 10/22/2016 CLINICAL DATA:  Fall today with left hip/ groin pain. EXAM: DG HIP (WITH OR WITHOUT PELVIS) 2-3V LEFT COMPARISON:  None. FINDINGS: Intramedullary rod with proximal screw fixation of both hips. The hardware is intact. No periprosthetic fracture. Both femoral heads appear seated in the acetabulum. Cortical irregularity of the left superior pubic ramus adjacent to the pubic body. Probable nondisplaced left inferior pubic ramus fracture.  Pubic symphysis remains congruent. The bones are under mineralized. IMPRESSION: Suspect acute nondisplaced left superior and inferior pubic rami fractures. Electronically Signed   By: Jeb Levering M.D.   On: 10/22/2016 00:20        Assessment/Plan  1. Pelvic fracture:  -Conservative management with pain control and PT -Morphine 4 mg IV when necessary today -Oxycodone 5 mg when necessary when initial pain control achieved -Bowel regimen -PT eval -Consult CM for HH     2. Recurrent UTIs and dysuria:  Patient endorses new dysuria. -Continue nitrofurantoin -Collect urinalysis and urine culture -Defer additional antibiotics  until culture results  3. Hypothyroidism:  -Continue levothyroxine  4. Mood disorder:  -Continue Wellbutrin and Lexapro  5. Chronic kidney disease:  At baseline 0.9-1.2.           DVT prophylaxis: Lovenox  Code Status: FULL  Family Communication: Daughter at bedside  Disposition Plan: Anticipate PT eval and pain control.   Consults called: None Admission status: OBS At the point of initial evaluation, it is my clinical opinion that admission for OBSERVATION is reasonable and necessary because the patient's presenting complaints in the context of their chronic conditions represent sufficient risk of deterioration or significant morbidity to constitute reasonable grounds for close observation in the hospital setting, but that the patient may be medically stable for discharge from the hospital within 24 to 48 hours.    Medical decision making: Patient seen at 5:15 AM on 10/22/2016.  The patient was discussed with Dr Betsey Holiday.  What exists of the patient's chart was reviewed in depth, outside records were obtained and summarized above.  Clinical condition: stable.        Edwin Dada [facility] Pager [phone]

## 2016-10-22 NOTE — ED Notes (Signed)
Pt wants to wait to get upstairs to allow staff to get urine

## 2016-10-22 NOTE — Clinical Social Work Note (Signed)
CSW consulted for New SNF. Pt is Medicare and under OBS, therefore does not qualify for SNF. RNCM is following for discharge planning with home health services. CSW is signing off as no further needs identified. Please reconsult if a need arises prior to discharge.   Darden Dates, MSW, LCSW  Clinical Social Worker  [phone]

## 2016-10-22 NOTE — ED Notes (Signed)
Daughter has concerns about pt ambulating. Pt says she does not feel like she can walk at this moment. Daughter states she has concerns about mother hallucinating while on morphine for extended amount of time

## 2016-10-22 NOTE — Evaluation (Signed)
Occupational Therapy Evaluation Patient Details Name: Rhonda Snyder MRN: [id number] DOB: 07-06-1931 Today's Date: 10/22/2016    History of Present Illness Pt is a 83 y.o. female with a past medical history significant for osteoporosis, recurrent UTI, hypothyroidism, and recurrent falls who presents with fall and hip pain. Pt is visiting her daughter from [place]. Admitted for pubic fracture.    Clinical Impression   Pt is typically assisted for showering and IADL. She walks with a rollator and does not drive. Pt presents with increased pain and guarding requiring 2 person assist for mobility and increased time. Pt with poor sitting and standing balance as she needed B UE support. Will follow acutely.    Follow Up Recommendations  SNF;Supervision/Assistance - 24 hour (HHOT and aide if pt goes home)    Equipment Recommendations  Wheelchair (measurements OT);Wheelchair cushion (measurements OT)    Recommendations for Other Services       Precautions / Restrictions Precautions Precautions: Fall Restrictions Weight Bearing Restrictions: No      Mobility Bed Mobility Overal bed mobility: Needs Assistance Bed Mobility: Rolling;Sidelying to Sit;Sit to Sidelying Rolling: Mod assist Sidelying to sit: Max assist     Sit to sidelying: Max assist General bed mobility comments: Pt needing assist due to pain and tendency to tense LEs.  Transfers Overall transfer level: Needs assistance Equipment used: Rolling walker (2 wheeled) Transfers: Sit to/from [facility] Sit to Stand: Mod assist;+2 physical assistance Stand pivot transfers: +2 physical assistance;Mod assist            Balance Overall balance assessment: Needs assistance Sitting-balance support: Bilateral upper extremity supported;Feet supported Sitting balance-Leahy Scale: Poor Sitting balance - Comments: Requires UE support while sitting EOB   Standing balance support: Bilateral upper  extremity supported;During functional activity Standing balance-Leahy Scale: Poor Standing balance comment: Reliant on RW for standing balance                            ADL Overall ADL's : Needs assistance/impaired Eating/Feeding: Bed level;Set up   Grooming: Wash/dry hands;Wash/dry face;Sitting;Supervision/safety   Upper Body Bathing: Sitting;Maximal assistance   Lower Body Bathing: Total assistance;Bed level   Upper Body Dressing : Sitting;Maximal assistance   Lower Body Dressing: Total assistance;Bed level                 General ADL Comments: Pt reports baseline incontinence. Participation in ADL in sitting and standing impeded by pt's need to use B UEs for support.     Vision     Perception     Praxis      Pertinent Vitals/Pain Pain Assessment: Faces Faces Pain Scale: Hurts whole lot Pain Location: LEs Pain Descriptors / Indicators: Spasm;Guarding;Grimacing Pain Intervention(s): Monitored during session;Premedicated before session;Repositioned     Hand Dominance Right   Extremity/Trunk Assessment Upper Extremity Assessment Upper Extremity Assessment: Overall WFL for tasks assessed   Lower Extremity Assessment Lower Extremity Assessment: Defer to PT evaluation       Communication Communication Communication: No difficulties   Cognition Arousal/Alertness: Awake/alert Behavior During Therapy: WFL for tasks assessed/performed Overall Cognitive Status: Within Functional Limits for tasks assessed                     General Comments       Exercises       Shoulder Instructions      Home Living Family/patient expects to be discharged to:: Private residence Living Arrangements: Children (  daughter; daughter's wife be present 24 hr. with 2 children) Available Help at Discharge: Family;Available 24 hours/day Type of Home: House Home Access: Level entry     Home Layout: Multi-level;Able to live on main level with  bedroom/bathroom     Bathroom Shower/Tub: Tub/shower unit   Bathroom Toilet: Handicapped height     Home Equipment: Bedside commode;Walker - 2 wheels;Walker - 4 wheels;Tub bench          Prior Functioning/Environment Level of Independence: Needs assistance  Gait / Transfers Assistance Needed: walks with rollator ADL's / Homemaking Assistance Needed: assisted with showering and IADL   Comments: pt lives alone in [place], has not driven since her hip fx last Feb        OT Problem List: Impaired balance (sitting and/or standing);Pain;Decreased knowledge of use of DME or AE;Decreased activity tolerance;Decreased strength   OT Treatment/Interventions: Self-care/ADL training;DME and/or AE instruction;Therapeutic activities;Patient/family education;Balance training    OT Goals(Current goals can be found in the care plan section) Acute Rehab OT Goals Patient Stated Goal: to go home and be stronger OT Goal Formulation: With patient Time For Goal Achievement: 11/05/16 Potential to Achieve Goals: Good ADL Goals Pt Will Perform Grooming: with supervision;with set-up;sitting Pt Will Perform Upper Body Bathing: with min assist;sitting Pt Will Perform Upper Body Dressing: with min assist;sitting Pt Will Transfer to Toilet: with mod assist;ambulating;bedside commode Pt Will Perform Toileting - Clothing Manipulation and hygiene: with mod assist;sit to/from stand Additional ADL Goal #1: Pt will perform bed mobility with min assist in preparation for ADL.  OT Frequency: Min 2X/week   Barriers to D/C:            Co-evaluation              End of Session Equipment Utilized During Treatment: Gait belt;Rolling walker  Activity Tolerance: Patient limited by pain Patient left: in bed;with call bell/phone within reach;with bed alarm set   Time: 1337-1400 OT Time Calculation (min): 23 min Charges:  OT General Charges $OT Visit: 1 Procedure OT Evaluation $OT Eval Moderate  Complexity: 1 Procedure OT Treatments $Self Care/Home Management : 8-22 mins G-Codes: OT G-codes **NOT FOR INPATIENT CLASS** Functional Assessment Tool Used: clinical judgement Functional Limitation: Self care Self Care Current Status [id number]): At least 80 percent but less than 100 percent impaired, limited or restricted Self Care Goal Status [id number]): At least 20 percent but less than 40 percent impaired, limited or restricted  Malka So 10/22/2016, 2:18 PM  [phone]

## 2016-10-22 NOTE — Care Management Note (Signed)
Case Management Note  Patient Details  Name: Zylynn Halbig MRN: [id number] Date of Birth: 1930-10-27  Subjective/Objective:          Adm w pelvic fracture          Action/Plan: visiting da from [place]. Da linda. Pt has had multi falls in past. Refuses snf in past. In as obs so would not qual for snf at this time.    Expected Discharge Date:                  Expected Discharge Plan:  [place]  In-House Referral:     Discharge planning Services  CM Consult  Post Acute Care Choice:  Home Health, Durable Medical Equipment Choice offered to:  Adult Children  DME Arranged:  3-N-1, Programmer, multimedia DME Agency:     HH Arranged:  PT, OT, Social Work, Nurse's Aide, RN [place] Agency:     Status of Service:  In process, will continue to follow  If discussed at Long Length of Stay Meetings, dates discussed:    Additional Comments:spoke w da linda. Left hhc agency list in room. Da states she is aware would not qual for snf and pt has been resistent in past. Da would like [facility] at [facility]. Da to try and hire some help at home. Da to look over hhc list. phy there rec [facility] and [facility]. Pt has rolator at home. To stay w da in [place] for few weeks before returning to [place]. Pt very alert.  Lacretia Leigh, RN 10/22/2016, 12:10 PM

## 2016-10-22 NOTE — ED Notes (Signed)
Medication was marked incomplete and was unable to finish in the room.

## 2016-10-22 NOTE — Care Management Obs Status (Signed)
[facility] NOTIFICATION   Patient Details  Name: Rhonda Snyder MRN: [id number] Date of Birth: September 03, 1931   Medicare Observation Status Notification Given:  Yes    Lacretia Leigh, RN 10/22/2016, 9:31 AM

## 2016-10-22 NOTE — ED Notes (Signed)
Pt did not want IV at this time. Pt would like to try PO medication. MD notified

## 2016-10-23 DIAGNOSIS — E039 Hypothyroidism, unspecified: Secondary | ICD-10-CM

## 2016-10-23 DIAGNOSIS — S32592A Other specified fracture of left pubis, initial encounter for closed fracture: Secondary | ICD-10-CM | POA: Diagnosis not present

## 2016-10-23 DIAGNOSIS — K5900 Constipation, unspecified: Secondary | ICD-10-CM | POA: Diagnosis not present

## 2016-10-23 DIAGNOSIS — S32509A Unspecified fracture of unspecified pubis, initial encounter for closed fracture: Secondary | ICD-10-CM

## 2016-10-23 DIAGNOSIS — R509 Fever, unspecified: Secondary | ICD-10-CM | POA: Diagnosis not present

## 2016-10-23 DIAGNOSIS — Z853 Personal history of malignant neoplasm of breast: Secondary | ICD-10-CM | POA: Diagnosis not present

## 2016-10-23 DIAGNOSIS — W010XXA Fall on same level from slipping, tripping and stumbling without subsequent striking against object, initial encounter: Secondary | ICD-10-CM | POA: Diagnosis present

## 2016-10-23 DIAGNOSIS — M810 Age-related osteoporosis without current pathological fracture: Secondary | ICD-10-CM | POA: Diagnosis not present

## 2016-10-23 DIAGNOSIS — Z79899 Other long term (current) drug therapy: Secondary | ICD-10-CM | POA: Diagnosis not present

## 2016-10-23 DIAGNOSIS — F329 Major depressive disorder, single episode, unspecified: Secondary | ICD-10-CM | POA: Diagnosis not present

## 2016-10-23 DIAGNOSIS — Z880 Allergy status to penicillin: Secondary | ICD-10-CM | POA: Diagnosis not present

## 2016-10-23 DIAGNOSIS — M25552 Pain in left hip: Secondary | ICD-10-CM | POA: Diagnosis present

## 2016-10-23 DIAGNOSIS — R300 Dysuria: Secondary | ICD-10-CM | POA: Diagnosis not present

## 2016-10-23 DIAGNOSIS — F39 Unspecified mood [affective] disorder: Secondary | ICD-10-CM | POA: Diagnosis present

## 2016-10-23 DIAGNOSIS — Z87440 Personal history of urinary (tract) infections: Secondary | ICD-10-CM | POA: Diagnosis not present

## 2016-10-23 DIAGNOSIS — R296 Repeated falls: Secondary | ICD-10-CM | POA: Diagnosis not present

## 2016-10-23 DIAGNOSIS — N183 Chronic kidney disease, stage 3 (moderate): Secondary | ICD-10-CM | POA: Diagnosis not present

## 2016-10-23 MED ORDER — HYDROMORPHONE HCL 2 MG/ML IJ SOLN: 0.5000 mg | INTRAMUSCULAR | Status: DC | PRN

## 2016-10-23 MED ORDER — DIAZEPAM 5 MG PO TABS
5.0000 mg | ORAL_TABLET | Freq: Three times a day (TID) | ORAL | Status: DC | PRN
  Administered 2016-10-23 – 2016-10-24 (×3): 5 mg via ORAL
  Filled 2016-10-23 (×3): qty 1

## 2016-10-23 MED ORDER — HYDROMORPHONE HCL 2 MG/ML IJ SOLN
0.5000 mg | INTRAMUSCULAR | Status: DC | PRN
  Administered 2016-10-23 – 2016-10-24 (×3): 1 mg via INTRAVENOUS
  Filled 2016-10-23 (×3): qty 1

## 2016-10-23 MED ORDER — SENNOSIDES-DOCUSATE SODIUM 8.6-50 MG PO TABS
1.0000 | ORAL_TABLET | Freq: Two times a day (BID) | ORAL | Status: DC
  Administered 2016-10-23 – 2016-10-26 (×7): 1 via ORAL
  Filled 2016-10-23 (×7): qty 1

## 2016-10-23 NOTE — Clinical Social Work Note (Signed)
CSW was reconsulted for New SNF as recommended by PT. Pt is still Medicare OBS, therefore Medicare will not cover SNF placement and pt would be have to pay privately. RNCM is following for discharge planning needs and has arranged home health services. CSW is signing off.   Darden Dates, MSW, LCSW  Clinical Social Worker  [phone]

## 2016-10-23 NOTE — Progress Notes (Signed)
Nutrition Brief Note  Patient identified on the Malnutrition Screening Tool (MST) Report  Wt Readings from Last 15 Encounters:  10/21/16 155 lb (70.3 kg)   Rhonda Snyder is a 83 y.o. female with a past medical history significant for osteoporosis, recurrent UTI, hypothyroidism, and recurrent falls who presents with fall and hip pain.  Spoke with pt at bedside, who reports fair appetite, generally consumes 2 meals per day. She denies any significant wt loss; estimates she has lost about 5# within the past 9 months secondary to staying in a rehab center for her hip replacement.   Nutrition-Focused physical exam completed. Findings are no fat depletion, no muscle depletion, and no edema.   Body mass index is 24.28 kg/m. Patient meets criteria for normal weight range based on current BMI.   Current diet order is regular, patient is consuming approximately 25-100% of meals at this time. Labs and medications reviewed.   No nutrition interventions warranted at this time. If nutrition issues arise, please consult RD.   Anthonymichael Munday A. Jimmye Norman, RD, LDN, CDE Pager: [phone] After hours Pager: [phone]

## 2016-10-23 NOTE — Progress Notes (Addendum)
Physical Therapy Treatment Patient Details Name: Rhonda Snyder MRN: [id number] DOB: 1931/01/13 Today's Date: 10/23/2016    History of Present Illness Pt is a 83 y.o. female with a past medical history significant for osteoporosis, recurrent UTI, hypothyroidism, and recurrent falls who presents with fall and hip pain. Pt is visiting her daughter from [place]. Admitted for pubic fracture.     PT Comments    Pt admitted with above diagnosis. Pt currently with functional limitations due to balance and endurance deficits as well as significant pain. Pt continues to struggle with OOB as her pain is limiting her progression. Took 3 attempts to get pt standing and when she stood she wasn't able to stand fully upright and she needed +2 mod to max assist.  Pt could not progress ambulation as well.  Spasms in left groin limiting pt considerably.  MD came in room as PT finishing up and MD aware that pt not progressing.  Notified MD of pts situation as her daughter works and her daughters wife is disabled in some way and is taking care of 2 toddlers at home during the day as well as they have 2 elementary age children.  Pt really would benefit from SNF.  Per chart, pt may have to go home due to insurance issues not paying for SNF.  HH recommendations below if this is the case. Pt will benefit from skilled PT to increase their independence and safety with mobility to allow discharge to the venue listed below.    Follow Up Recommendations  SNF (HHPT, HHOT, [facility] if insurance does not cover SNF stay)     Equipment Recommendations  Other (comment);Wheelchair (measurements PT);Wheelchair cushion (measurements PT);3in1 (PT) (18x18, antitippers, desk arms, elevating leg rests)    Recommendations for Other Services       Precautions / Restrictions Precautions Precautions: Fall Restrictions Weight Bearing Restrictions: No    Mobility  Bed Mobility Overal bed mobility: Needs Assistance Bed Mobility:  Rolling;Sidelying to Sit;Sit to Sidelying Rolling: Mod assist Sidelying to sit: Max assist       General bed mobility comments: Pt needing assist due to pain and tendency to tense LEs.  Incr time to come to EOB with extensive assist due to pain.   Transfers Overall transfer level: Needs assistance Equipment used: Rolling walker (2 wheeled) Transfers: Sit to/from [facility] Sit to Stand: Mod assist;+2 physical assistance;Max assist Stand pivot transfers: +2 physical assistance;Mod assist;Max assist       General transfer comment: Max A +2 to power to stand with pt unable first 2 attempts.  Ended up stretching left LE several times trying to stretch hamstrings and buttock as pt had a cramp in buttock.  on Third attempt, pt was able to come to partial stand and maintain standing balance with RW but never was able to place left LE flat on floor. Pt  forward lean onto RW and was able to stand and pivot wtih RW to recliner with incr cues to technique and assist to move RW and feet.  Pt able to take small steps to pivot to chair. Cues for use of UE to offload LEs and for trunk upright. Pt needed assist to control descent into chair as she tends to want to plop back as she is so fatigued.  Very poor safety awareness as well due to pain.   Ambulation/Gait             General Gait Details: could not progress as pt unable to tolerate  full weight on left LE.     Stairs            Wheelchair Mobility    Modified Rankin (Stroke Patients Only)       Balance Overall balance assessment: Needs assistance Sitting-balance support: Bilateral upper extremity supported;Feet supported Sitting balance-Leahy Scale: Poor Sitting balance - Comments: Requires UE support while sitting EOB and initially needing mod assist as she was hurting and had posterior lean.     Standing balance support: Bilateral upper extremity supported;During functional activity Standing balance-Leahy  Scale: Poor Standing balance comment: Reliant on RW for standing balance and cannot stand fully upright even with assist and cues.                     Cognition Arousal/Alertness: Awake/alert Behavior During Therapy: WFL for tasks assessed/performed Overall Cognitive Status: Within Functional Limits for tasks assessed                      Exercises General Exercises - Lower Extremity Ankle Circles/Pumps: AROM;Both;10 reps;Supine Straight Leg Raises: AAROM;Both;5 reps;Supine Other Exercises Other Exercises: Instructed pt to use sheet and stretch her left Hamstring and buttock  as it was cramping     General Comments        Pertinent Vitals/Pain Pain Assessment: Faces Faces Pain Scale: Hurts worst Pain Location: groin left Pain Descriptors / Indicators: Spasm;Guarding;Grimacing Pain Intervention(s): Limited activity within patient's tolerance;Monitored during session;Premedicated before session;Repositioned;Patient requesting pain meds-RN notified  VSS  Home Living                      Prior Function            PT Goals (current goals can now be found in the care plan section) Acute Rehab PT Goals Patient Stated Goal: to go home and be stronger Progress towards PT goals: Not progressing toward goals - comment (pain limiting pt considerably)    Frequency    Min 5X/week      PT Plan Current plan remains appropriate    Co-evaluation             End of Session Equipment Utilized During Treatment: Gait belt Activity Tolerance: Patient limited by fatigue;Patient limited by pain Patient left: in chair;with call bell/phone within reach     Time: [id number] PT Time Calculation (min) (ACUTE ONLY): 30 min  Charges:  $Therapeutic Exercise: 8-22 mins $Therapeutic Activity: 8-22 mins                    G Codes:      Rhonda Snyder 10/27/2016, 11:15 AM Rhonda Snyder,PT Acute Rehabilitation [phone] (pager)

## 2016-10-23 NOTE — Progress Notes (Signed)
[facility]                                                                              Patient Demographics  Rhonda Snyder, is a 83 y.o. female, DOB - [birth year], [id number]  Admit date - 10/21/2016   Admitting Physician Edwin Dada, MD  Outpatient Primary MD for the patient is No PCP Per Patient  Outpatient specialists:   LOS - 0  days    Chief Complaint  Patient presents with  . Fall       Brief summary   Patient is 83 year old female with partial processes, recurrent UTI, hypothyroidism and recurrent falls presented with fall and hip pain. Patient is visiting her daughter from [place]. She had a mechanical fall and afterwards had left hip and pelvic pain and difficulty walking.  In ED, CT head and C-spine were unremarkable. Radiographs of the left hip showed pubic rami fractures, nondisplaced. Patient lives in [place] alone and has an aide who comes in 5 nights/ week and a housekeeper. Patient has numerous falls resulting in fractures. Patient also reported new onset low back pain with dysuria typical of her UTI.   Assessment & Plan    Principal Problem:   Closed fracture of single pubic ramus of pelvis ([place]) - Patient was placed on conservative management with antispasmodic, pain control and PT - Patient seen and examined with the 2 physical therapists who were working with her at the time when I came into the room for my encounter. Unfortunately, patient was in significant pain 8/10 and in spasms, limiting her physical therapy and could not progress to ambulation at all, was not even able to stand fully upright and needed 2 person assist. - I have discontinued Robaxin, changed to Valium, placed on IV Dilaudid for pain control - Difficult situation, patient needs maximum physical therapy and assistance at this point, home is unsafe disposition for this patient, she does not have 24/ 7 supervision, needs to 2 person max assistance -  Placed on bowel regimen  Active Problems:   Hypothyroidism, acquired - Continue Synthroid    CKD (chronic kidney disease), stage III - Creatinine at baseline    Dysuria - UA and culture is still pending, I have ordered stat UA and culture again   Code Status: full code  DVT Prophylaxis:  Lovenox  Family Communication: Discussed in detail with the patient, all imaging results, lab results explained to the patient    Disposition Plan:   Time Spent in minutes   1minutes  Procedures:  CT head  CT C-spine  Consultants:   None  Antimicrobials:   Nitrofurantoin   Medications  Scheduled Meds: . acetaminophen  1,000 mg Oral TID  . buPROPion  300 mg Oral Daily  . enoxaparin (LOVENOX) injection  40 mg Subcutaneous Q24H  . escitalopram  10 mg Oral Daily  . feeding supplement (ENSURE ENLIVE)  237 mL Oral BID BM  . levothyroxine  137 mcg Oral QAC breakfast  . nitrofurantoin  50 mg Oral QHS  . senna-docusate  1 tablet Oral BID   Continuous Infusions: PRN Meds:.diazepam, HYDROmorphone (DILAUDID) injection, oxyCODONE  Antibiotics   Anti-infectives    None        Subjective:   Rhonda Snyder was seen and examined today.  In significant pain, 8/10 in pelvic region radiating down to groin with spasms, somewhat tearful. Patient denies dizziness, chest pain, shortness of breath,  N/V, new weakness, numbess, tingling. + Constipation  Objective:   Vitals:   10/22/16 2158 10/23/16 0153 10/23/16 0600 10/23/16 1018  BP: (!) 105/44 (!) 104/50 (!) 129/59 (!) 95/53  Pulse: 90 75 88 95  Resp: 15 15 16 16   Temp: 98.5 F (36.9 C) 98 F (36.7 C) 98.4 F (36.9 C) 99.7 F (37.6 C)  TempSrc: Oral Oral Oral Oral  SpO2: 92% 95% 96% 96%  Weight:      Height:        Intake/Output Summary (Last 24 hours) at 10/23/16 1120 Last data filed at 10/23/16 1019  Gross per 24 hour  Intake              870 ml  Output                0 ml  Net              870 ml     Wt  Readings from Last 3 Encounters:  10/21/16 70.3 kg (155 lb)     Exam  General: Alert and oriented x 3, NAD  HEENT:    Neck:   Cardiovascular: S1 S2 auscultated, no rubs, murmurs or gallops. Regular rate and rhythm.  Respiratory: Clear to auscultation bilaterally, no wheezing, rales or rhonchi  Gastrointestinal: Soft, nontender, nondistended, + bowel sounds  Ext: no cyanosis clubbing or edema  Neuro: AAOx3, Cr N's II- XII. Strength 5/5 upper and lower extremities bilaterally  Skin: No rashes  Psych: Normal affect and demeanor, alert and oriented x3    Data Reviewed:  I have personally reviewed following labs and imaging studies  Micro Results No results found for this or any previous visit (from the past 240 hour(s)).  Radiology Reports Ct Head Wo Contrast  Result Date: 10/22/2016 CLINICAL DATA:  Post unwitnessed fall today with headache. EXAM: CT HEAD WITHOUT CONTRAST CT CERVICAL SPINE WITHOUT CONTRAST TECHNIQUE: Multidetector CT imaging of the head and cervical spine was performed following the standard protocol without intravenous contrast. Multiplanar CT image reconstructions of the cervical spine were also generated. COMPARISON:  None. FINDINGS: CT HEAD FINDINGS Brain: No evidence of acute infarction, hemorrhage, hydrocephalus, extra-axial collection or mass lesion/mass effect. Moderate generalized atrophy. Mild chronic small vessel ischemia. Vascular: No hyperdense vessel or unexpected calcification. Skull: Normal. Negative for fracture or focal lesion. Sinuses/Orbits: Bilateral cataract resection. Orbits are otherwise unremarkable. Paranasal sinuses and mastoid air cells well-aerated. Other: None. CT CERVICAL SPINE FINDINGS Alignment: Minimal degenerative-type anterolisthesis of C4 on C5. Alignment is otherwise maintained. Skull base and vertebrae: No acute fracture. No primary bone lesion or focal pathologic process. Chronic degenerative change at C1-C2 articulation. Soft  tissues and spinal canal: No prevertebral fluid or swelling. No visible canal hematoma. Disc levels: Disc space narrowing and endplate spurring most prominent at C5-C6 and C6-C7. There is multifocal facet arthropathy. Multilevel neural foraminal stenosis. Upper chest: No acute abnormality. Mild biapical pleuroparenchymal scarring. Other: Carotid vascular calcifications, left greater than right. IMPRESSION: 1. No acute intracranial abnormality or skull fracture. Generalized atrophy and chronic small vessel ischemia. 2. Degenerative change in the cervical spine without acute fracture or subluxation. Electronically Signed   By: Fonnie Birkenhead.D.  On: 10/22/2016 00:34   Ct Cervical Spine Wo Contrast  Result Date: 10/22/2016 CLINICAL DATA:  Post unwitnessed fall today with headache. EXAM: CT HEAD WITHOUT CONTRAST CT CERVICAL SPINE WITHOUT CONTRAST TECHNIQUE: Multidetector CT imaging of the head and cervical spine was performed following the standard protocol without intravenous contrast. Multiplanar CT image reconstructions of the cervical spine were also generated. COMPARISON:  None. FINDINGS: CT HEAD FINDINGS Brain: No evidence of acute infarction, hemorrhage, hydrocephalus, extra-axial collection or mass lesion/mass effect. Moderate generalized atrophy. Mild chronic small vessel ischemia. Vascular: No hyperdense vessel or unexpected calcification. Skull: Normal. Negative for fracture or focal lesion. Sinuses/Orbits: Bilateral cataract resection. Orbits are otherwise unremarkable. Paranasal sinuses and mastoid air cells well-aerated. Other: None. CT CERVICAL SPINE FINDINGS Alignment: Minimal degenerative-type anterolisthesis of C4 on C5. Alignment is otherwise maintained. Skull base and vertebrae: No acute fracture. No primary bone lesion or focal pathologic process. Chronic degenerative change at C1-C2 articulation. Soft tissues and spinal canal: No prevertebral fluid or swelling. No visible canal hematoma.  Disc levels: Disc space narrowing and endplate spurring most prominent at C5-C6 and C6-C7. There is multifocal facet arthropathy. Multilevel neural foraminal stenosis. Upper chest: No acute abnormality. Mild biapical pleuroparenchymal scarring. Other: Carotid vascular calcifications, left greater than right. IMPRESSION: 1. No acute intracranial abnormality or skull fracture. Generalized atrophy and chronic small vessel ischemia. 2. Degenerative change in the cervical spine without acute fracture or subluxation. Electronically Signed   By: Jeb Levering M.D.   On: 10/22/2016 00:34   Dg Hip Unilat W Or Wo Pelvis 2-3 Views Left  Result Date: 10/22/2016 CLINICAL DATA:  Fall today with left hip/ groin pain. EXAM: DG HIP (WITH OR WITHOUT PELVIS) 2-3V LEFT COMPARISON:  None. FINDINGS: Intramedullary rod with proximal screw fixation of both hips. The hardware is intact. No periprosthetic fracture. Both femoral heads appear seated in the acetabulum. Cortical irregularity of the left superior pubic ramus adjacent to the pubic body. Probable nondisplaced left inferior pubic ramus fracture. Pubic symphysis remains congruent. The bones are under mineralized. IMPRESSION: Suspect acute nondisplaced left superior and inferior pubic rami fractures. Electronically Signed   By: Jeb Levering M.D.   On: 10/22/2016 00:20    Lab Data:  CBC:  Recent Labs Lab 10/22/16 0042  WBC 9.0  NEUTROABS 7.8*  HGB 12.9  HCT 39.5  MCV 101.0*  PLT [id number]   Basic Metabolic Panel:  Recent Labs Lab 10/22/16 0042  NA 141  K 3.6  CL 104  CO2 25  GLUCOSE 97  BUN 20  CREATININE 1.11*  CALCIUM 9.4   GFR: Estimated Creatinine Clearance: 37.3 mL/min (by C-G formula based on SCr of 1.11 mg/dL (H)). Liver Function Tests: No results for input(s): AST, ALT, ALKPHOS, BILITOT, PROT, ALBUMIN in the last 168 hours. No results for input(s): LIPASE, AMYLASE in the last 168 hours. No results for input(s): AMMONIA in the last 168  hours. Coagulation Profile: No results for input(s): INR, PROTIME in the last 168 hours. Cardiac Enzymes: No results for input(s): CKTOTAL, CKMB, CKMBINDEX, TROPONINI in the last 168 hours. BNP (last 3 results) No results for input(s): PROBNP in the last 8760 hours. HbA1C: No results for input(s): HGBA1C in the last 72 hours. CBG: No results for input(s): GLUCAP in the last 168 hours. Lipid Profile: No results for input(s): CHOL, HDL, LDLCALC, TRIG, CHOLHDL, LDLDIRECT in the last 72 hours. Thyroid Function Tests: No results for input(s): TSH, T4TOTAL, FREET4, T3FREE, THYROIDAB in the last 72 hours. Anemia Panel: No  results for input(s): VITAMINB12, FOLATE, FERRITIN, TIBC, IRON, RETICCTPCT in the last 72 hours. Urine analysis: No results found for: COLORURINE, APPEARANCEUR, LABSPEC, PHURINE, GLUCOSEU, HGBUR, BILIRUBINUR, KETONESUR, PROTEINUR, UROBILINOGEN, NITRITE, Corliss Skains M.D. [facility] 10/23/2016, 11:20 AM  Pager: [phone] Between 7am to 7pm - call Pager - [phone]  After 7pm go to [email or web address] - password [email or web address]  Call night coverage person covering after 7pm

## 2016-10-23 NOTE — Progress Notes (Signed)
MD order UA and urine culture.  Pt incontinent of urine.  Notified MD and inquired about doing I/O.  No new orders.  Will continue to monitor.

## 2016-10-23 NOTE — Progress Notes (Signed)
Occupational Therapy Treatment Patient Details Name: Rhonda Snyder MRN: [id number] DOB: 1931/05/17 Today's Date: 10/23/2016    History of present illness Pt is a 82 y.o. female with a past medical history significant for osteoporosis, recurrent UTI, hypothyroidism, and recurrent falls who presents with fall and hip pain. Pt is visiting her daughter from [place]. Admitted for pubic fracture.    OT comments  Pt currently still needing +2 assistance for sit to stand, toileting, and transfer to the bed.  Do not feel pt could be managed at home at this level, will need extensive SNF level therapy to progress enough to reach min assist level or better for home based on current level.  Will continue to follow in acute care.   Follow Up Recommendations  SNF    Equipment Recommendations  Wheelchair cushion (measurements OT);Wheelchair (measurements OT);Other (comment);Tub/shower bench (drop arm commode)    Recommendations for Other Services      Precautions / Restrictions Precautions Precautions: Fall Restrictions Weight Bearing Restrictions: No       Mobility Bed Mobility   Bed Mobility: Sit to Supine Rolling: Mod assist     Sit to supine: +2 for physical assistance;Max assist   General bed mobility comments: Pt needed assistance at trunk and at LEs secondary to pain during transitional movements.   Transfers Overall transfer level: Needs assistance Equipment used: Rolling walker (2 wheeled) Transfers: Stand Pivot Transfers Sit to Stand: +2 safety/equipment;Max assist Stand pivot transfers: Max assist            Balance   Sitting-balance support: Bilateral upper extremity supported;Feet supported Sitting balance-Leahy Scale: Poor Sitting balance - Comments: Needs use of UEs on arms of chair for sitting balance.      Standing balance-Leahy Scale: Poor Standing balance comment: Pt needs RW and assistance for standing balance.                    ADL  Overall ADL's : Needs assistance/impaired                         Toilet Transfer: Maximal assistance;+2 for physical assistance;Stand-pivot;RW   Toileting- Clothing Manipulation and Hygiene: +2 for physical assistance;Maximal assistance;Sit to/from stand         General ADL Comments: Pt incontinent of bladder in bedside chair.  Needed 2 attempts with +2 assistance for sit to stand and to complete peri hygiene in standing.  Pt unable to bear weight over the LLE with pivot to bed using the RW.                  Cognition   Behavior During Therapy: WFL for tasks assessed/performed Overall Cognitive Status: Within Functional Limits for tasks assessed                                    Pertinent Vitals/ Pain       Pain Assessment: Faces Faces Pain Scale: Hurts whole lot Pain Location: left upper leg/hip region Pain Descriptors / Indicators: Discomfort;Grimacing Pain Intervention(s): Limited activity within patient's tolerance;Repositioned         Frequency  Min 2X/week        Progress Toward Goals  OT Goals(current goals can now be found in the care plan section)  Progress towards OT goals: Progressing toward goals     Plan Discharge plan remains appropriate  End of Session Equipment Utilized During Treatment: Rolling walker   Activity Tolerance Patient limited by pain   Patient Left in bed;with call bell/phone within reach;with bed alarm set   Nurse Communication Mobility status        Time: [id number] OT Time Calculation (min): 24 min  Charges: OT General Charges $OT Visit: 1 Procedure OT Treatments $Self Care/Home Management : 23-37 mins  Elby Blackwelder OTR/L 10/23/2016, 3:01 PM

## 2016-10-24 LAB — URINALYSIS, ROUTINE W REFLEX MICROSCOPIC
Bilirubin Urine: NEGATIVE
Glucose, UA: NEGATIVE mg/dL
Hgb urine dipstick: NEGATIVE
Ketones, ur: NEGATIVE mg/dL
LEUKOCYTES UA: NEGATIVE
Nitrite: NEGATIVE
PROTEIN: NEGATIVE mg/dL
SPECIFIC GRAVITY, URINE: 1.015 (ref 1.005–1.030)
pH: 5 (ref 5.0–8.0)

## 2016-10-24 MED ORDER — DIAZEPAM 2 MG PO TABS
2.0000 mg | ORAL_TABLET | Freq: Three times a day (TID) | ORAL | Status: DC | PRN
  Administered 2016-10-24 – 2016-10-25 (×3): 2 mg via ORAL
  Filled 2016-10-24 (×3): qty 1

## 2016-10-24 MED ORDER — HYDROMORPHONE HCL 2 MG/ML IJ SOLN: 0.5000 mg | INTRAMUSCULAR | Status: DC | PRN

## 2016-10-24 MED ORDER — LEVOFLOXACIN IN D5W 500 MG/100ML IV SOLN
500.0000 mg | INTRAVENOUS | Status: DC
  Administered 2016-10-24: 500 mg via INTRAVENOUS
  Filled 2016-10-24: qty 100

## 2016-10-24 MED ORDER — SODIUM CHLORIDE 0.9 % IV SOLN
INTRAVENOUS | Status: DC
  Administered 2016-10-24 – 2016-10-25 (×2): via INTRAVENOUS

## 2016-10-24 NOTE — Progress Notes (Signed)
[facility]                                                                              Patient Demographics  Rhonda Snyder, is a 83 y.o. female, DOB - 12-10-1930, [id number]  Admit date - 10/21/2016   Admitting Physician Edwin Dada, MD  Outpatient Primary MD for the patient is No PCP Per Patient  Outpatient specialists:   LOS - 1  days    Chief Complaint  Patient presents with  . Fall       Brief summary   Patient is 83 year old female with partial processes, recurrent UTI, hypothyroidism and recurrent falls presented with fall and hip pain. Patient is visiting her daughter from [place]. She had a mechanical fall and afterwards had left hip and pelvic pain and difficulty walking.  In ED, CT head and C-spine were unremarkable. Radiographs of the left hip showed pubic rami fractures, nondisplaced. Patient lives in [place] alone and has an aide who comes in 5 nights/ week and a housekeeper. Patient has numerous falls resulting in fractures. Patient also reported new onset low back pain with dysuria typical of her UTI.   Assessment & Plan    Principal Problem:   Closed fracture of single pubic ramus of pelvis ([place]) - Patient was placed on conservative management with antispasmodic, pain control and PT - Cont pain control, PT,  bowel regimen - too sleepy with dilaudid, hold dilaudid   Active Problems: Fever - possible UTI, had presented with dysuria on adm - UA and culture obtained, allergy to PCN, started on levaquin      Hypothyroidism, acquired - Continue Synthroid    CKD (chronic kidney disease), stage III - Creatinine at baseline   Code Status: full code  DVT Prophylaxis:  Lovenox  Family Communication: Discussed in detail with the patient, all imaging results, lab results explained to the patient    Disposition Plan: will need SNF   Time Spent in minutes   25 minutes  Procedures:  CT head  CT  C-spine  Consultants:   None  Antimicrobials:   Nitrofurantoin   Medications  Scheduled Meds: . acetaminophen  1,000 mg Oral TID  . buPROPion  300 mg Oral Daily  . enoxaparin (LOVENOX) injection  40 mg Subcutaneous Q24H  . escitalopram  10 mg Oral Daily  . levofloxacin (LEVAQUIN) IV  500 mg Intravenous Q24H  . levothyroxine  137 mcg Oral QAC breakfast  . senna-docusate  1 tablet Oral BID   Continuous Infusions: . sodium chloride 75 mL/hr at 10/24/16 1426   PRN Meds:.diazepam, oxyCODONE   Antibiotics   Anti-infectives    Start     Dose/Rate Route Frequency Ordered Stop   10/24/16 1545  levofloxacin (LEVAQUIN) IVPB 500 mg     500 mg 100 mL/hr over 60 Minutes Intravenous Every 24 hours 10/24/16 1544          Subjective:   Rhonda Snyder was seen and examined today. Sleepy, spiking temp, tmax 101F. Patient denies dizziness, chest pain, shortness of breath,  N/V, new weakness, numbess, tingling.   Objective:   Vitals:   10/23/16 1451 10/23/16  2210 10/24/16 1300 10/24/16 1310  BP:  102/62 (!) 131/59   Pulse: (!) 110  (!) 107   Resp:  18    Temp:  98.2 F (36.8 C) (!) 101 F (38.3 C)   TempSrc:  Oral Oral   SpO2: 93% 96% (!) 85% 92%  Weight:      Height:        Intake/Output Summary (Last 24 hours) at 10/24/16 1545 Last data filed at 10/23/16 1857  Gross per 24 hour  Intake              120 ml  Output                0 ml  Net              120 ml     Wt Readings from Last 3 Encounters:  10/21/16 70.3 kg (155 lb)     Exam  General: sleepy but arousable and oriented NAD  HEENT:    Neck:   Cardiovascular: S1 S2 clear, RRR.  Respiratory: Clear to auscultation bilaterally, no wheezing, rales or rhonchi  Gastrointestinal: Soft, nontender, nondistended, + bowel sounds  Ext: no cyanosis clubbing or edema  Neuro:   Skin: No rashes  Psych: sleepy   Data Reviewed:  I have personally reviewed following labs and imaging studies  Micro  Results No results found for this or any previous visit (from the past 240 hour(s)).  Radiology Reports Ct Head Wo Contrast  Result Date: 10/22/2016 CLINICAL DATA:  Post unwitnessed fall today with headache. EXAM: CT HEAD WITHOUT CONTRAST CT CERVICAL SPINE WITHOUT CONTRAST TECHNIQUE: Multidetector CT imaging of the head and cervical spine was performed following the standard protocol without intravenous contrast. Multiplanar CT image reconstructions of the cervical spine were also generated. COMPARISON:  None. FINDINGS: CT HEAD FINDINGS Brain: No evidence of acute infarction, hemorrhage, hydrocephalus, extra-axial collection or mass lesion/mass effect. Moderate generalized atrophy. Mild chronic small vessel ischemia. Vascular: No hyperdense vessel or unexpected calcification. Skull: Normal. Negative for fracture or focal lesion. Sinuses/Orbits: Bilateral cataract resection. Orbits are otherwise unremarkable. Paranasal sinuses and mastoid air cells well-aerated. Other: None. CT CERVICAL SPINE FINDINGS Alignment: Minimal degenerative-type anterolisthesis of C4 on C5. Alignment is otherwise maintained. Skull base and vertebrae: No acute fracture. No primary bone lesion or focal pathologic process. Chronic degenerative change at C1-C2 articulation. Soft tissues and spinal canal: No prevertebral fluid or swelling. No visible canal hematoma. Disc levels: Disc space narrowing and endplate spurring most prominent at C5-C6 and C6-C7. There is multifocal facet arthropathy. Multilevel neural foraminal stenosis. Upper chest: No acute abnormality. Mild biapical pleuroparenchymal scarring. Other: Carotid vascular calcifications, left greater than right. IMPRESSION: 1. No acute intracranial abnormality or skull fracture. Generalized atrophy and chronic small vessel ischemia. 2. Degenerative change in the cervical spine without acute fracture or subluxation. Electronically Signed   By: Jeb Levering M.D.   On: 10/22/2016  00:34   Ct Cervical Spine Wo Contrast  Result Date: 10/22/2016 CLINICAL DATA:  Post unwitnessed fall today with headache. EXAM: CT HEAD WITHOUT CONTRAST CT CERVICAL SPINE WITHOUT CONTRAST TECHNIQUE: Multidetector CT imaging of the head and cervical spine was performed following the standard protocol without intravenous contrast. Multiplanar CT image reconstructions of the cervical spine were also generated. COMPARISON:  None. FINDINGS: CT HEAD FINDINGS Brain: No evidence of acute infarction, hemorrhage, hydrocephalus, extra-axial collection or mass lesion/mass effect. Moderate generalized atrophy. Mild chronic small vessel ischemia. Vascular: No hyperdense vessel or unexpected calcification.  Skull: Normal. Negative for fracture or focal lesion. Sinuses/Orbits: Bilateral cataract resection. Orbits are otherwise unremarkable. Paranasal sinuses and mastoid air cells well-aerated. Other: None. CT CERVICAL SPINE FINDINGS Alignment: Minimal degenerative-type anterolisthesis of C4 on C5. Alignment is otherwise maintained. Skull base and vertebrae: No acute fracture. No primary bone lesion or focal pathologic process. Chronic degenerative change at C1-C2 articulation. Soft tissues and spinal canal: No prevertebral fluid or swelling. No visible canal hematoma. Disc levels: Disc space narrowing and endplate spurring most prominent at C5-C6 and C6-C7. There is multifocal facet arthropathy. Multilevel neural foraminal stenosis. Upper chest: No acute abnormality. Mild biapical pleuroparenchymal scarring. Other: Carotid vascular calcifications, left greater than right. IMPRESSION: 1. No acute intracranial abnormality or skull fracture. Generalized atrophy and chronic small vessel ischemia. 2. Degenerative change in the cervical spine without acute fracture or subluxation. Electronically Signed   By: Jeb Levering M.D.   On: 10/22/2016 00:34   Dg Hip Unilat W Or Wo Pelvis 2-3 Views Left  Result Date: 10/22/2016 CLINICAL  DATA:  Fall today with left hip/ groin pain. EXAM: DG HIP (WITH OR WITHOUT PELVIS) 2-3V LEFT COMPARISON:  None. FINDINGS: Intramedullary rod with proximal screw fixation of both hips. The hardware is intact. No periprosthetic fracture. Both femoral heads appear seated in the acetabulum. Cortical irregularity of the left superior pubic ramus adjacent to the pubic body. Probable nondisplaced left inferior pubic ramus fracture. Pubic symphysis remains congruent. The bones are under mineralized. IMPRESSION: Suspect acute nondisplaced left superior and inferior pubic rami fractures. Electronically Signed   By: Jeb Levering M.D.   On: 10/22/2016 00:20    Lab Data:  CBC:  Recent Labs Lab 10/22/16 0042  WBC 9.0  NEUTROABS 7.8*  HGB 12.9  HCT 39.5  MCV 101.0*  PLT [id number]   Basic Metabolic Panel:  Recent Labs Lab 10/22/16 0042  NA 141  K 3.6  CL 104  CO2 25  GLUCOSE 97  BUN 20  CREATININE 1.11*  CALCIUM 9.4   GFR: Estimated Creatinine Clearance: 37.3 mL/min (by C-G formula based on SCr of 1.11 mg/dL (H)). Liver Function Tests: No results for input(s): AST, ALT, ALKPHOS, BILITOT, PROT, ALBUMIN in the last 168 hours. No results for input(s): LIPASE, AMYLASE in the last 168 hours. No results for input(s): AMMONIA in the last 168 hours. Coagulation Profile: No results for input(s): INR, PROTIME in the last 168 hours. Cardiac Enzymes: No results for input(s): CKTOTAL, CKMB, CKMBINDEX, TROPONINI in the last 168 hours. BNP (last 3 results) No results for input(s): PROBNP in the last 8760 hours. HbA1C: No results for input(s): HGBA1C in the last 72 hours. CBG: No results for input(s): GLUCAP in the last 168 hours. Lipid Profile: No results for input(s): CHOL, HDL, LDLCALC, TRIG, CHOLHDL, LDLDIRECT in the last 72 hours. Thyroid Function Tests: No results for input(s): TSH, T4TOTAL, FREET4, T3FREE, THYROIDAB in the last 72 hours. Anemia Panel: No results for input(s): VITAMINB12,  FOLATE, FERRITIN, TIBC, IRON, RETICCTPCT in the last 72 hours. Urine analysis:    Component Value Date/Time   COLORURINE YELLOW 10/24/2016 1520   APPEARANCEUR CLEAR 10/24/2016 1520   LABSPEC 1.015 10/24/2016 1520   PHURINE 5.0 10/24/2016 1520   GLUCOSEU NEGATIVE 10/24/2016 1520   HGBUR NEGATIVE 10/24/2016 [place] 10/24/2016 1520   KETONESUR NEGATIVE 10/24/2016 1520   PROTEINUR NEGATIVE 10/24/2016 1520   NITRITE NEGATIVE 10/24/2016 [place] 10/24/2016 [place] M.D. [facility] 10/24/2016, 3:45 PM  Pager: [phone] Between 7am to 7pm - call Pager - [phone]  After 7pm go to [email or web address] - password [email or web address]  Call night coverage person covering after 7pm

## 2016-10-24 NOTE — Progress Notes (Signed)
1300 temp 101, Dr Tana Coast notified. Pt is more awake now compared to this morning. Encouraged oral fluids. IVF started. UA and Urine C&S obtained thru in and out cath.

## 2016-10-24 NOTE — Care Management Note (Addendum)
Case Management Note  Patient Details  Name: Rhonda Snyder MRN: [id number] Date of Birth: 1931/08/10  Subjective/Objective:       Fall, Pelvic fracture        Action/Plan: Discharge Planning: Please see previous NCM notes. Plan is for SNF placement. CSW following for placement.   Strawn to dtr, [place]. Requesting [place] for SNF rehab. Message sent to [place]. Provided pt's dtr with info on Medicare.gov to research SNF.   Expected Discharge Date:                 Expected Discharge Plan:  Skilled Nursing Facility  In-House Referral:  Clinical Social Work  Discharge planning Services  CM Consult  Post Acute Care Choice:  Home Health, Durable Medical Equipment Choice offered to:  Adult Children  DME Arranged:  N/A DME Agency:  NA  HH Arranged:  NA HH Agency:  NA  Status of Service:  Completed, signed off  If discussed at [place] of Stay Meetings, dates discussed:    Additional Comments:  Erenest Rasher, RN 10/24/2016, 11:46 AM

## 2016-10-24 NOTE — Progress Notes (Signed)
Physical Therapy Treatment Patient Details Name: Rhonda Snyder MRN: [id number] DOB: 1931/05/09 Today's Date: 10/24/2016    History of Present Illness Pt is a 83 y.o. female with a past medical history significant for osteoporosis, recurrent UTI, hypothyroidism, and recurrent falls who presents with fall and hip pain. Pt is visiting her daughter from [place]. Admitted for pubic fracture.     PT Comments    Pt not progressing towards goals. RN notes that pt has high fever this afternoon. Pt c/o fatigue but was agreeable to sit EOB for 6 minutes. Pt in high pain with bed mobility and required mod A +2 to move LE out of and into bed. Also required mod A for trunk upright and demonstrated significant posterior lean upon sitting. Pt unable to maintain sitting balance without assist. Pt with decreased strength as indicated by limited ability to participate in HEP- pt with 2/5 strength in B quads (unable to move through full ROM against gravity). Pt continues to be appropriate for SNF following d/c due to limited mobility and decreased activity tolerance. PT will continue to follow acutely.   Follow Up Recommendations  SNF (HHPT, [place], [place] if insurance does not cover SNF)     Equipment Recommendations  Other (comment);Wheelchair (measurements PT);Wheelchair cushion (measurements PT);3in1 (PT) (18x18, antitippers, elevating leg rests, desk arms)    Recommendations for Other Services       Precautions / Restrictions Precautions Precautions: Fall Restrictions Weight Bearing Restrictions: No    Mobility  Bed Mobility Overal bed mobility: Needs Assistance Bed Mobility: Rolling;Sidelying to Sit Rolling: Mod assist Sidelying to sit: Mod assist;+2 for physical assistance   Sit to supine: +2 for physical assistance;Max assist   General bed mobility comments: Pt required mod A +2 for LE mobility and trunk upright from sit<>stand.    Transfers                 General transfer  comment: Pt declined to transfer due to high fever and pain  Ambulation/Gait                 Stairs            Wheelchair Mobility    Modified Rankin (Stroke Patients Only)       Balance Overall balance assessment: Needs assistance Sitting-balance support: Bilateral upper extremity supported;Feet supported Sitting balance-Leahy Scale: Poor Sitting balance - Comments: Significant posterior lean. Unable to maintain sitting balance independently. Required Mod A +1 to maintain sitting balance Postural control: Posterior lean                          Cognition Arousal/Alertness: Awake/alert Behavior During Therapy: WFL for tasks assessed/performed Overall Cognitive Status: Within Functional Limits for tasks assessed                      Exercises General Exercises - Lower Extremity Ankle Circles/Pumps: AROM;Both;10 reps;Seated Long Arc Quad: AROM;Both;10 reps;Seated Hip Flexion/Marching: AROM;Both;10 reps;Seated    General Comments        Pertinent Vitals/Pain Pain Assessment: Faces Faces Pain Scale: Hurts whole lot Pain Location: left upper leg/hip region Pain Descriptors / Indicators: Discomfort;Grimacing Pain Intervention(s): Limited activity within patient's tolerance;Monitored during session    Home Living                      Prior Function            PT Goals (current goals  can now be found in the care plan section) Acute Rehab PT Goals Patient Stated Goal: to go home and be stronger PT Goal Formulation: With patient Time For Goal Achievement: 11/05/16 Potential to Achieve Goals: Fair Progress towards PT goals: Not progressing toward goals - comment (pain and fever limiting pt)    Frequency    Min 5X/week      PT Plan Current plan remains appropriate    Co-evaluation             End of Session Equipment Utilized During Treatment: Gait belt Activity Tolerance: Patient limited by fatigue;Patient  limited by pain Patient left: with call bell/phone within reach;in bed     Time: 1342-1406 PT Time Calculation (min) (ACUTE ONLY): 24 min  Charges:  $Therapeutic Activity: 23-37 mins                    G Codes:      Tonia Brooms 2016-11-13, 3:49 PM Tonia Brooms, SPT [phone]

## 2016-10-25 LAB — BLOOD CULTURE ID PANEL (REFLEXED)
ACINETOBACTER BAUMANNII: NOT DETECTED
CANDIDA ALBICANS: NOT DETECTED
CANDIDA TROPICALIS: NOT DETECTED
Candida glabrata: NOT DETECTED
Candida krusei: NOT DETECTED
Candida parapsilosis: NOT DETECTED
ENTEROBACTERIACEAE SPECIES: NOT DETECTED
Enterobacter cloacae complex: NOT DETECTED
Enterococcus species: NOT DETECTED
Escherichia coli: NOT DETECTED
HAEMOPHILUS INFLUENZAE: NOT DETECTED
KLEBSIELLA PNEUMONIAE: NOT DETECTED
Klebsiella oxytoca: NOT DETECTED
Listeria monocytogenes: NOT DETECTED
METHICILLIN RESISTANCE: DETECTED — AB
NEISSERIA MENINGITIDIS: NOT DETECTED
Proteus species: NOT DETECTED
Pseudomonas aeruginosa: NOT DETECTED
STAPHYLOCOCCUS SPECIES: DETECTED — AB
STREPTOCOCCUS PYOGENES: NOT DETECTED
STREPTOCOCCUS SPECIES: NOT DETECTED
Serratia marcescens: NOT DETECTED
Staphylococcus aureus (BCID): NOT DETECTED
Streptococcus agalactiae: NOT DETECTED
Streptococcus pneumoniae: NOT DETECTED

## 2016-10-25 LAB — BASIC METABOLIC PANEL
Anion gap: 7 (ref 5–15)
BUN: 17 mg/dL (ref 6–20)
CALCIUM: 8.5 mg/dL — AB (ref 8.9–10.3)
CO2: 27 mmol/L (ref 22–32)
CREATININE: 1.03 mg/dL — AB (ref 0.44–1.00)
Chloride: 105 mmol/L (ref 101–111)
GFR calc Af Amer: 57 mL/min — ABNORMAL LOW (ref >60)
GFR calc non Af Amer: 49 mL/min — ABNORMAL LOW (ref >60)
GLUCOSE: 106 mg/dL — AB (ref 65–99)
Potassium: 4.2 mmol/L (ref 3.5–5.1)
Sodium: 139 mmol/L (ref 135–145)

## 2016-10-25 LAB — CBC
HEMATOCRIT: 32 % — AB (ref 36.0–46.0)
Hemoglobin: 10.2 g/dL — ABNORMAL LOW (ref 12.0–15.0)
MCH: 32.8 pg (ref 26.0–34.0)
MCHC: 31.9 g/dL (ref 30.0–36.0)
MCV: 102.9 fL — AB (ref 78.0–100.0)
Platelets: 158 10*3/uL (ref 150–400)
RBC: 3.11 MIL/uL — ABNORMAL LOW (ref 3.87–5.11)
RDW: 13.6 % (ref 11.5–15.5)
WBC: 5.9 10*3/uL (ref 4.0–10.5)

## 2016-10-25 LAB — URINE CULTURE: CULTURE: NO GROWTH

## 2016-10-25 MED ORDER — VANCOMYCIN HCL 10 G IV SOLR
1500.0000 mg | Freq: Once | INTRAVENOUS | Status: DC
  Filled 2016-10-25: qty 1500

## 2016-10-25 MED ORDER — VANCOMYCIN HCL IN DEXTROSE 750-5 MG/150ML-% IV SOLN: 750.0000 mg | INTRAVENOUS | Status: DC

## 2016-10-25 MED ORDER — POLYETHYLENE GLYCOL 3350 17 G PO PACK
17.0000 g | PACK | Freq: Every day | ORAL | Status: DC | PRN
  Administered 2016-10-25: 17 g via ORAL
  Filled 2016-10-25: qty 1

## 2016-10-25 MED ORDER — NITROFURANTOIN MACROCRYSTAL 50 MG PO CAPS: 50.0000 mg | ORAL_CAPSULE | Freq: Every day | ORAL | Status: DC

## 2016-10-25 NOTE — NC FL2 (Signed)
[place] LEVEL OF CARE SCREENING TOOL     IDENTIFICATION  Patient Name: Rhonda Snyder Birthdate: 07-Jul-1931 Sex: female Admission Date (Current Location): 10/21/2016  [facility] and [place] Number:  Herbalist and Address:  [facility] [place], [place] [address] [place], [place] [address]      Provider Number: [id number]  Attending Physician Name and Address:  Mendel Corning, MD  Relative Name and Phone Number:       Current Level of Care: Hospital Recommended Level of Care: [place] Prior Approval Number:    Date Approved/Denied:   PASRR Number:    Discharge Plan: SNF    Current Diagnoses: Patient Active Problem List   Diagnosis Date Noted  . Closed fracture of single pubic ramus of pelvis ([place]) 10/22/2016  . Hypothyroidism, acquired 10/22/2016  . CKD (chronic kidney disease), stage III 10/22/2016  . Dysuria 10/22/2016    Orientation RESPIRATION BLADDER Height & Weight     Self, Time, Situation, Place  Normal Incontinent Weight: 155 lb (70.3 kg) Height:  5\' 7"  (170.2 cm)  BEHAVIORAL SYMPTOMS/MOOD NEUROLOGICAL BOWEL NUTRITION STATUS      Continent Diet (Regular Diet)  AMBULATORY STATUS COMMUNICATION OF NEEDS Skin   Extensive Assist Verbally Normal                       Personal Care Assistance Level of Assistance  Bathing, Feeding, Dressing Bathing Assistance: Limited assistance Feeding assistance: Independent Dressing Assistance: Limited assistance     Functional Limitations Info  Sight, Hearing, Speech Sight Info: Adequate        SPECIAL CARE FACTORS FREQUENCY  PT (By licensed PT), OT (By licensed OT)     PT Frequency: 5 OT Frequency: 5            Contractures Contractures Info: Not present    Additional Factors Info  Code Status, Allergies, Psychotropic Code Status Info: Full Code Allergies Info: Penicillins Psychotropic Info: Lexapro         Current Medications  (10/25/2016):  This is the current hospital active medication list Current Facility-Administered Medications  Medication Dose Route Frequency Provider Last Rate Last Dose  . acetaminophen (TYLENOL) tablet 1,000 mg  1,000 mg Oral TID Edwin Dada, MD   1,000 mg at 10/25/16 1014  . buPROPion (WELLBUTRIN XL) 24 hr tablet 300 mg  300 mg Oral Daily Edwin Dada, MD   300 mg at 10/25/16 1014  . diazepam (VALIUM) tablet 2 mg  2 mg Oral Q8H PRN Ripudeep Krystal Eaton, MD   2 mg at 10/25/16 1014  . enoxaparin (LOVENOX) injection 40 mg  40 mg Subcutaneous Q24H Edwin Dada, MD   40 mg at 10/25/16 0827  . escitalopram (LEXAPRO) tablet 10 mg  10 mg Oral Daily Edwin Dada, MD   10 mg at 10/25/16 1014  . levothyroxine (SYNTHROID, LEVOTHROID) tablet 137 mcg  137 mcg Oral QAC breakfast Edwin Dada, MD   137 mcg at 10/25/16 0827  . oxyCODONE (Oxy IR/ROXICODONE) immediate release tablet 5 mg  5 mg Oral Q4H PRN Edwin Dada, MD   5 mg at 10/25/16 1307  . polyethylene glycol (MIRALAX / GLYCOLAX) packet 17 g  17 g Oral Daily PRN Ripudeep K Rai, MD   17 g at 10/25/16 1300  . senna-docusate (Senokot-S) tablet 1 tablet  1 tablet Oral BID Ripudeep Krystal Eaton, MD   1 tablet at 10/25/16 1014  Discharge Medications: Please see discharge summary for a list of discharge medications.  Relevant Imaging Results:  Relevant Lab Results:   Additional Information SSN:  [id number]  Darden Dates, LCSW

## 2016-10-25 NOTE — Progress Notes (Addendum)
Pt's daughter requests to talk to the Dr of blood culture results. Dr Tana Coast notified, she will call daughter [name] today.

## 2016-10-25 NOTE — Progress Notes (Signed)
Physical Therapy Treatment Patient Details Name: Rhonda Snyder MRN: [id number] DOB: [birth year] Today's Date: 10/25/2016    History of Present Illness Pt is a 83 y.o. female with a past medical history significant for osteoporosis, recurrent UTI, hypothyroidism, and recurrent falls who presents with fall and hip pain. Pt is visiting her daughter from [place]. Admitted for pubic fracture.     PT Comments    Pt admitted with above diagnosis. Pt currently with functional limitations due to balance and endurance deficits as well as pain limiting pt greatly with mobility.  Pt was able to stand with [facility] for several minutes 3 times today.  Pt spasms limit pt but Stedy did allow some weight bearing for pt today.  Nursing to use [facility] for transfers as well. Pt frequency changed to 3 x week since pt is going SNF as this is appropriate frequency for standards of care.  Will continue acute PT.  Pt will benefit from skilled PT to increase their independence and safety with mobility to allow discharge to the venue listed below.    Follow Up Recommendations  SNF;Supervision/Assistance - 24 hour (HHPT, HHOT, [place] if insurance does not cover SNF)     Equipment Recommendations  Other (comment);Wheelchair (measurements PT);Wheelchair cushion (measurements PT);3in1 (PT) (18x18, antitippers, elevating leg rests, desk arms)    Recommendations for Other Services       Precautions / Restrictions Precautions Precautions: Fall Restrictions Weight Bearing Restrictions: No    Mobility  Bed Mobility Overal bed mobility: Needs Assistance Bed Mobility: Rolling;Sidelying to Sit Rolling: Mod assist Sidelying to sit: Mod assist;+2 for physical assistance       General bed mobility comments: Pt required mod A for LE mobility and trunk upright from supine to sit.  Takes incr time due to pt with alot of pain.    Transfers Overall transfer level: Needs assistance   Transfers: Sit to/from Stand Sit to  Stand: Max assist;+2 physical assistance;Mod assist;From elevated surface Stand pivot transfers: Total assist (used Stedy)       General transfer comment: Pt requires mod to max assist with use of pad to stand.  Pt has difficulty using her UEs effectively.  Also limited by pain.  With gait belt and pad, pt was able to get fully upright on her feet and even get her left heel on floor.    Ambulation/Gait             General Gait Details: could not progress as pt unable to tolerate full weight on left LE.     Stairs            Wheelchair Mobility    Modified Rankin (Stroke Patients Only)       Balance Overall balance assessment: Needs assistance Sitting-balance support: Bilateral upper extremity supported;Feet supported Sitting balance-Leahy Scale: Poor Sitting balance - Comments: Significant posterior lean. Unable to maintain sitting balance independently. Required min A +1 to maintain sitting balance once balance achieved.   Postural control: Posterior lean Standing balance support: Bilateral upper extremity supported;During functional activity Standing balance-Leahy Scale: Poor Standing balance comment: Pt needs Stedy with UE support  and assistance for standing balance. Once up, pt stood with [facility] for 4 minutes, 3 minutes and 2 minutes with sitting rest breaks on Stedy in between.                      Cognition Arousal/Alertness: Awake/alert Behavior During Therapy: WFL for tasks assessed/performed Overall Cognitive Status: Within Functional Limits  for tasks assessed                      Exercises General Exercises - Lower Extremity Ankle Circles/Pumps: AROM;Both;10 reps;Seated Long Arc Quad: AROM;Both;10 reps;Seated Heel Slides: AAROM;Left;5 reps;Supine Straight Leg Raises: AAROM;Left;5 reps;Supine Other Exercises Other Exercises: Heel cord stretch to left LE prior to movement.     General Comments        Pertinent Vitals/Pain Pain  Assessment: Faces Faces Pain Scale: Hurts whole lot Pain Location: left upper leg/hip region, buttock Pain Descriptors / Indicators: Discomfort;Grimacing Pain Intervention(s): Limited activity within patient's tolerance;Monitored during session;Premedicated before session;Repositioned   VSS Home Living                      Prior Function            PT Goals (current goals can now be found in the care plan section) Acute Rehab PT Goals Patient Stated Goal: to go home and be stronger Progress towards PT goals: Progressing toward goals    Frequency    Min 3X/week      PT Plan Frequency needs to be updated    Co-evaluation             End of Session Equipment Utilized During Treatment: Gait belt Activity Tolerance: Patient limited by fatigue;Patient limited by pain Patient left: with call bell/phone within reach;in chair;with family/visitor present     Time: [id number] PT Time Calculation (min) (ACUTE ONLY): 48 min  Charges:  $Therapeutic Exercise: 8-22 mins $Therapeutic Activity: 23-37 mins                    G Codes:      Denice Paradise 11-06-2016, 11:39 AM [facility] [phone] (pager)

## 2016-10-25 NOTE — Progress Notes (Addendum)
[facility]                                                                              Patient Demographics  Rhonda Snyder, is a 83 y.o. female, DOB - 1931-05-18, OG:[id number]  Admit date - 10/21/2016   Admitting Physician Edwin Dada, MD  Outpatient Primary MD for the patient is No PCP Per Patient  Outpatient specialists:   LOS - 2  days    Chief Complaint  Patient presents with  . Fall       Brief summary   Patient is 83 year old female with partial processes, recurrent UTI, hypothyroidism and recurrent falls presented with fall and hip pain. Patient is visiting her daughter from [place]. She had a mechanical fall and afterwards had left hip and pelvic pain and difficulty walking.  In ED, CT head and C-spine were unremarkable. Radiographs of the left hip showed pubic rami fractures, nondisplaced. Patient lives in [place] alone and has an aide who comes in 5 nights/ week and a housekeeper. Patient has numerous falls resulting in fractures. Patient also reported new onset low back pain with dysuria typical of her UTI.   Assessment & Plan    Principal Problem:   Closed fracture of single pubic ramus of pelvis ([place]) - Patient was placed on conservative management with antispasmodic, pain control and PT - Cont pain control, PT,  bowel regimen  Active Problems: Fever --Isolated incident, no more fevers, UA negative for UTI - Blood culture ID panel however showed MRSA staph, for now will place on vancomycin and continue to follow the blood culture results (contaminant??) Addendum:  Spoke with ID, Dr Linus Salmons, recommended to DC vancomycin, no MRSA, this is likely coagulase neg staph, contaminant    Hypothyroidism, acquired - Continue Synthroid    CKD (chronic kidney disease), stage III - Creatinine at baseline   Code Status: full code  DVT Prophylaxis:  Lovenox  Family Communication: Discussed in detail with the patient, all imaging  results, lab results explained to the patient    Disposition Plan: will need SNF   Time Spent in minutes   25 minutes  Procedures:  CT head  CT C-spine  Consultants:   None  Antimicrobials:   Nitrofurantoin   Medications  Scheduled Meds: . acetaminophen  1,000 mg Oral TID  . buPROPion  300 mg Oral Daily  . enoxaparin (LOVENOX) injection  40 mg Subcutaneous Q24H  . escitalopram  10 mg Oral Daily  . levothyroxine  137 mcg Oral QAC breakfast  . senna-docusate  1 tablet Oral BID   Continuous Infusions:  PRN Meds:.diazepam, oxyCODONE, polyethylene glycol   Antibiotics   Anti-infectives    Start     Dose/Rate Route Frequency Ordered Stop   10/24/16 1700  levofloxacin (LEVAQUIN) IVPB 500 mg  Status:  Discontinued     500 mg 100 mL/hr over 60 Minutes Intravenous Every 24 hours 10/24/16 1544 10/25/16 1011        Subjective:   [name] was seen and examined today. Feeling better today, alert and awake however complaining of spasms in her groin and left leg.  Patient denies dizziness,  chest pain, shortness of breath,  N/V, new weakness, numbess, tingling.   Objective:   Vitals:   10/24/16 1625 10/24/16 2035 10/25/16 0559 10/25/16 1327  BP:  (!) 92/56 90/60 (!) 109/57  Pulse:  78 73 79  Resp:  18 18 18   Temp: 98.2 F (36.8 C) 98.4 F (36.9 C) 98 F (36.7 C) 97.2 F (36.2 C)  TempSrc:  Oral Oral Axillary  SpO2:  97% 94% 94%  Weight:      Height:        Intake/Output Summary (Last 24 hours) at 10/25/16 1348 Last data filed at 10/25/16 1340  Gross per 24 hour  Intake          1228.75 ml  Output              350 ml  Net           878.75 ml     Wt Readings from Last 3 Encounters:  10/21/16 70.3 kg (155 lb)     Exam  General: Alert and oriented 3, NAD  HEENT:    Neck:   Cardiovascular: S1 S2 clear, RRR.  Respiratory: CTAB  Gastrointestinal: Soft, nontender, nondistended, + bowel sounds  Ext: no cyanosis clubbing or edema  Neuro:    Skin: No rashes  Psych:alert and oriented 3 NAD  Data Reviewed:  I have personally reviewed following labs and imaging studies  Micro Results Recent Results (from the past 240 hour(s))  Culture, blood (routine x 2)     Status: None (Preliminary result)   Collection Time: 10/24/16  2:48 PM  Result Value Ref Range Status   Specimen Description BLOOD LEFT HAND  Final   Special Requests BOTTLES DRAWN AEROBIC ONLY 5CC  Final   Culture  Setup Time   Final    GRAM POSITIVE COCCI IN CLUSTERS AEROBIC BOTTLE ONLY CRITICAL RESULT CALLED TO, READ BACK BY AND VERIFIED WITH: Karlene Einstein, [place] AT [place] 10/25/16 BY L BENFIELD    Culture GRAM POSITIVE COCCI  Final   Report Status PENDING  Incomplete  Blood Culture ID Panel (Reflexed)     Status: Abnormal   Collection Time: 10/24/16  2:48 PM  Result Value Ref Range Status   Enterococcus species NOT DETECTED NOT DETECTED Final   Listeria monocytogenes NOT DETECTED NOT DETECTED Final   Staphylococcus species DETECTED (A) NOT DETECTED Final    Comment: CRITICAL RESULT CALLED TO, READ BACK BY AND VERIFIED WITH: N BATCHELDER,PHARMD AT 1345 10/25/16 BY L BENFIELD    Staphylococcus aureus NOT DETECTED NOT DETECTED Final   Methicillin resistance DETECTED (A) NOT DETECTED Final    Comment: CRITICAL RESULT CALLED TO, READ BACK BY AND VERIFIED WITH: N BATCHELDER,PHARMD AT 1345 10/25/16 BY L BENFIELD    Streptococcus species NOT DETECTED NOT DETECTED Final   Streptococcus agalactiae NOT DETECTED NOT DETECTED Final   Streptococcus pneumoniae NOT DETECTED NOT DETECTED Final   Streptococcus pyogenes NOT DETECTED NOT DETECTED Final   Acinetobacter baumannii NOT DETECTED NOT DETECTED Final   Enterobacteriaceae species NOT DETECTED NOT DETECTED Final   Enterobacter cloacae complex NOT DETECTED NOT DETECTED Final   Escherichia coli NOT DETECTED NOT DETECTED Final   Klebsiella oxytoca NOT DETECTED NOT DETECTED Final   Klebsiella pneumoniae NOT DETECTED NOT  DETECTED Final   Proteus species NOT DETECTED NOT DETECTED Final   Serratia marcescens NOT DETECTED NOT DETECTED Final   Haemophilus influenzae NOT DETECTED NOT DETECTED Final   Neisseria meningitidis NOT DETECTED NOT DETECTED Final  Pseudomonas aeruginosa NOT DETECTED NOT DETECTED Final   Candida albicans NOT DETECTED NOT DETECTED Final   Candida glabrata NOT DETECTED NOT DETECTED Final   Candida krusei NOT DETECTED NOT DETECTED Final   Candida parapsilosis NOT DETECTED NOT DETECTED Final   Candida tropicalis NOT DETECTED NOT DETECTED Final  Urine culture     Status: None   Collection Time: 10/24/16  3:21 PM  Result Value Ref Range Status   Specimen Description URINE, RANDOM  Final   Special Requests NONE  Final   Culture NO GROWTH  Final   Report Status 10/25/2016 FINAL  Final    Radiology Reports Ct Head Wo Contrast  Result Date: 10/22/2016 CLINICAL DATA:  Post unwitnessed fall today with headache. EXAM: CT HEAD WITHOUT CONTRAST CT CERVICAL SPINE WITHOUT CONTRAST TECHNIQUE: Multidetector CT imaging of the head and cervical spine was performed following the standard protocol without intravenous contrast. Multiplanar CT image reconstructions of the cervical spine were also generated. COMPARISON:  None. FINDINGS: CT HEAD FINDINGS Brain: No evidence of acute infarction, hemorrhage, hydrocephalus, extra-axial collection or mass lesion/mass effect. Moderate generalized atrophy. Mild chronic small vessel ischemia. Vascular: No hyperdense vessel or unexpected calcification. Skull: Normal. Negative for fracture or focal lesion. Sinuses/Orbits: Bilateral cataract resection. Orbits are otherwise unremarkable. Paranasal sinuses and mastoid air cells well-aerated. Other: None. CT CERVICAL SPINE FINDINGS Alignment: Minimal degenerative-type anterolisthesis of C4 on C5. Alignment is otherwise maintained. Skull base and vertebrae: No acute fracture. No primary bone lesion or focal pathologic process.  Chronic degenerative change at C1-C2 articulation. Soft tissues and spinal canal: No prevertebral fluid or swelling. No visible canal hematoma. Disc levels: Disc space narrowing and endplate spurring most prominent at C5-C6 and C6-C7. There is multifocal facet arthropathy. Multilevel neural foraminal stenosis. Upper chest: No acute abnormality. Mild biapical pleuroparenchymal scarring. Other: Carotid vascular calcifications, left greater than right. IMPRESSION: 1. No acute intracranial abnormality or skull fracture. Generalized atrophy and chronic small vessel ischemia. 2. Degenerative change in the cervical spine without acute fracture or subluxation. Electronically Signed   By: Jeb Levering M.D.   On: 10/22/2016 00:34   Ct Cervical Spine Wo Contrast  Result Date: 10/22/2016 CLINICAL DATA:  Post unwitnessed fall today with headache. EXAM: CT HEAD WITHOUT CONTRAST CT CERVICAL SPINE WITHOUT CONTRAST TECHNIQUE: Multidetector CT imaging of the head and cervical spine was performed following the standard protocol without intravenous contrast. Multiplanar CT image reconstructions of the cervical spine were also generated. COMPARISON:  None. FINDINGS: CT HEAD FINDINGS Brain: No evidence of acute infarction, hemorrhage, hydrocephalus, extra-axial collection or mass lesion/mass effect. Moderate generalized atrophy. Mild chronic small vessel ischemia. Vascular: No hyperdense vessel or unexpected calcification. Skull: Normal. Negative for fracture or focal lesion. Sinuses/Orbits: Bilateral cataract resection. Orbits are otherwise unremarkable. Paranasal sinuses and mastoid air cells well-aerated. Other: None. CT CERVICAL SPINE FINDINGS Alignment: Minimal degenerative-type anterolisthesis of C4 on C5. Alignment is otherwise maintained. Skull base and vertebrae: No acute fracture. No primary bone lesion or focal pathologic process. Chronic degenerative change at C1-C2 articulation. Soft tissues and spinal canal: No  prevertebral fluid or swelling. No visible canal hematoma. Disc levels: Disc space narrowing and endplate spurring most prominent at C5-C6 and C6-C7. There is multifocal facet arthropathy. Multilevel neural foraminal stenosis. Upper chest: No acute abnormality. Mild biapical pleuroparenchymal scarring. Other: Carotid vascular calcifications, left greater than right. IMPRESSION: 1. No acute intracranial abnormality or skull fracture. Generalized atrophy and chronic small vessel ischemia. 2. Degenerative change in the cervical spine without acute  fracture or subluxation. Electronically Signed   By: Jeb Levering M.D.   On: 10/22/2016 00:34   Dg Hip Unilat W Or Wo Pelvis 2-3 Views Left  Result Date: 10/22/2016 CLINICAL DATA:  Fall today with left hip/ groin pain. EXAM: DG HIP (WITH OR WITHOUT PELVIS) 2-3V LEFT COMPARISON:  None. FINDINGS: Intramedullary rod with proximal screw fixation of both hips. The hardware is intact. No periprosthetic fracture. Both femoral heads appear seated in the acetabulum. Cortical irregularity of the left superior pubic ramus adjacent to the pubic body. Probable nondisplaced left inferior pubic ramus fracture. Pubic symphysis remains congruent. The bones are under mineralized. IMPRESSION: Suspect acute nondisplaced left superior and inferior pubic rami fractures. Electronically Signed   By: Jeb Levering M.D.   On: 10/22/2016 00:20    Lab Data:  CBC:  Recent Labs Lab 10/22/16 0042  WBC 9.0  NEUTROABS 7.8*  HGB 12.9  HCT 39.5  MCV 101.0*  PLT [id number]   Basic Metabolic Panel:  Recent Labs Lab 10/22/16 0042  NA 141  K 3.6  CL 104  CO2 25  GLUCOSE 97  BUN 20  CREATININE 1.11*  CALCIUM 9.4   GFR: Estimated Creatinine Clearance: 37.3 mL/min (by C-G formula based on SCr of 1.11 mg/dL (H)). Liver Function Tests: No results for input(s): AST, ALT, ALKPHOS, BILITOT, PROT, ALBUMIN in the last 168 hours. No results for input(s): LIPASE, AMYLASE in the last 168  hours. No results for input(s): AMMONIA in the last 168 hours. Coagulation Profile: No results for input(s): INR, PROTIME in the last 168 hours. Cardiac Enzymes: No results for input(s): CKTOTAL, CKMB, CKMBINDEX, TROPONINI in the last 168 hours. BNP (last 3 results) No results for input(s): PROBNP in the last 8760 hours. HbA1C: No results for input(s): HGBA1C in the last 72 hours. CBG: No results for input(s): GLUCAP in the last 168 hours. Lipid Profile: No results for input(s): CHOL, HDL, LDLCALC, TRIG, CHOLHDL, LDLDIRECT in the last 72 hours. Thyroid Function Tests: No results for input(s): TSH, T4TOTAL, FREET4, T3FREE, THYROIDAB in the last 72 hours. Anemia Panel: No results for input(s): VITAMINB12, FOLATE, FERRITIN, TIBC, IRON, RETICCTPCT in the last 72 hours. Urine analysis:    Component Value Date/Time   COLORURINE YELLOW 10/24/2016 1520   APPEARANCEUR CLEAR 10/24/2016 1520   LABSPEC 1.015 10/24/2016 1520   PHURINE 5.0 10/24/2016 1520   GLUCOSEU NEGATIVE 10/24/2016 1520   HGBUR NEGATIVE 10/24/2016 [place] 10/24/2016 1520   KETONESUR NEGATIVE 10/24/2016 1520   PROTEINUR NEGATIVE 10/24/2016 1520   NITRITE NEGATIVE 10/24/2016 [place] 10/24/2016 [place] M.D. [facility] 10/25/2016, 1:48 PM  Pager: [phone] Between 7am to 7pm - call Pager - [phone]  After 7pm go to [email or web address] - password [email or web address]  Call night coverage person covering after 7pm

## 2016-10-25 NOTE — Progress Notes (Signed)
10/26/2015 1030 NCM spoke to dtr, Lindey and she selected [facility] and she has been to facility to complete paperwork. CSW Judson Roch was notified of dtr's choice. Jonnie Finner RN CCM Case Mgmt phone [phone]

## 2016-10-25 NOTE — Progress Notes (Signed)
  PHARMACY - PHYSICIAN COMMUNICATION CRITICAL VALUE ALERT - BLOOD CULTURE IDENTIFICATION (BCID)  Results for orders placed or performed during the hospital encounter of 10/21/16  Blood Culture ID Panel (Reflexed) (Collected: 10/24/2016  2:48 PM)  Result Value Ref Range   Enterococcus species NOT DETECTED NOT DETECTED   Listeria monocytogenes NOT DETECTED NOT DETECTED   Staphylococcus species DETECTED (A) NOT DETECTED   Staphylococcus aureus NOT DETECTED NOT DETECTED   Methicillin resistance DETECTED (A) NOT DETECTED   Streptococcus species NOT DETECTED NOT DETECTED   Streptococcus agalactiae NOT DETECTED NOT DETECTED   Streptococcus pneumoniae NOT DETECTED NOT DETECTED   Streptococcus pyogenes NOT DETECTED NOT DETECTED   Acinetobacter baumannii NOT DETECTED NOT DETECTED   Enterobacteriaceae species NOT DETECTED NOT DETECTED   Enterobacter cloacae complex NOT DETECTED NOT DETECTED   Escherichia coli NOT DETECTED NOT DETECTED   Klebsiella oxytoca NOT DETECTED NOT DETECTED   Klebsiella pneumoniae NOT DETECTED NOT DETECTED   Proteus species NOT DETECTED NOT DETECTED   Serratia marcescens NOT DETECTED NOT DETECTED   Haemophilus influenzae NOT DETECTED NOT DETECTED   Neisseria meningitidis NOT DETECTED NOT DETECTED   Pseudomonas aeruginosa NOT DETECTED NOT DETECTED   Candida albicans NOT DETECTED NOT DETECTED   Candida glabrata NOT DETECTED NOT DETECTED   Candida krusei NOT DETECTED NOT DETECTED   Candida parapsilosis NOT DETECTED NOT DETECTED   Candida tropicalis NOT DETECTED NOT DETECTED    Name of physician (or Provider) Contacted: R. Rai  1/2 [facility] in clusters. BCID detects staph species with mec A gene. Most likely contaminant. Tmax of 101, WBC wnl.  Changes to prescribed antibiotics required: Discussed with Dr. Tana Coast. Would like to start vancomycin due to fever.  Elenor Quinones, PharmD, BCPS Clinical Pharmacist Pager [phone] 10/25/2016 1:55 PM

## 2016-10-25 NOTE — Progress Notes (Signed)
Pharmacy Antibiotic Note  Rhonda Snyder is a 83 y.o. female admitted on 10/21/2016 with possible UTI and MRSE bacteremia.  Pharmacy has been consulted for vancomycin dosing.  Patient had slight fever yesterday and urine cx was no growth. Unsure of source of fever. SCr 1.03, CrCl ~61ml/min  Plan: Give vancomycin 1.5g IV x 1, then start vancomycin 750mg  IV Q24 Monitor clinical picture, renal function, VT prn F/U C&S, abx deescalation / LOT  If continues to clinically improve consider stopping vancomycin and monitoring off abx  Height: 5\' 7"  (170.2 cm) Weight: 155 lb (70.3 kg) IBW/kg (Calculated) : 61.6  Temp (24hrs), Avg:98 F (36.7 C), Min:97.2 F (36.2 C), Max:98.4 F (36.9 C)    Recent Labs Lab 10/22/16 0042 10/25/16 1458  WBC 9.0 5.9  CREATININE 1.11* 1.03*    Estimated Creatinine Clearance: 40.2 mL/min (by C-G formula based on SCr of 1.03 mg/dL (H)).    Allergies  Allergen Reactions  . Penicillins Rash    Antimicrobials this admission: Vancomycin 1/19 >>   Dose adjustments this admission: n/a  Microbiology results: 1/18 BCx: 1/2 staph species 1/18 UCx: ngF  Thank you for allowing pharmacy to be a part of this patient's care.  Elenor Quinones, PharmD, BCPS Clinical Pharmacist Pager [phone] 10/25/2016 3:55 PM

## 2016-10-26 LAB — BASIC METABOLIC PANEL
Anion gap: 10 (ref 5–15)
BUN: 19 mg/dL (ref 6–20)
CHLORIDE: 105 mmol/L (ref 101–111)
CO2: 27 mmol/L (ref 22–32)
CREATININE: 0.98 mg/dL (ref 0.44–1.00)
Calcium: 9.1 mg/dL (ref 8.9–10.3)
GFR calc non Af Amer: 52 mL/min — ABNORMAL LOW (ref >60)
GLUCOSE: 85 mg/dL (ref 65–99)
Potassium: 4.6 mmol/L (ref 3.5–5.1)
Sodium: 142 mmol/L (ref 135–145)

## 2016-10-26 LAB — CBC
HEMATOCRIT: 34.4 % — AB (ref 36.0–46.0)
HEMOGLOBIN: 11 g/dL — AB (ref 12.0–15.0)
MCH: 33.1 pg (ref 26.0–34.0)
MCHC: 32 g/dL (ref 30.0–36.0)
MCV: 103.6 fL — AB (ref 78.0–100.0)
Platelets: 177 10*3/uL (ref 150–400)
RBC: 3.32 MIL/uL — ABNORMAL LOW (ref 3.87–5.11)
RDW: 13.4 % (ref 11.5–15.5)
WBC: 4.7 10*3/uL (ref 4.0–10.5)

## 2016-10-26 MED ORDER — POLYETHYLENE GLYCOL 3350 17 G PO PACK: 17.0000 g | PACK | Freq: Every day | ORAL | Status: DC

## 2016-10-26 MED ORDER — DIAZEPAM 2 MG PO TABS: 2.0000 mg | ORAL_TABLET | Freq: Three times a day (TID) | ORAL | 0 refills | Status: DC | PRN

## 2016-10-26 MED ORDER — OXYCODONE HCL 5 MG PO TABS: 5.0000 mg | ORAL_TABLET | ORAL | 0 refills | Status: DC | PRN

## 2016-10-26 MED ORDER — EXEMESTANE 25 MG PO TABS
25.0000 mg | ORAL_TABLET | Freq: Every evening | ORAL | Status: DC
  Filled 2016-10-26: qty 1

## 2016-10-26 MED ORDER — ACETAMINOPHEN 500 MG PO CHEW: 500.0000 mg | CHEWABLE_TABLET | Freq: Three times a day (TID) | ORAL | 0 refills | Status: DC

## 2016-10-26 MED ORDER — MAGNESIUM CITRATE PO SOLN: 1.0000 | Freq: Once | ORAL | Status: DC

## 2016-10-26 MED ORDER — SENNOSIDES-DOCUSATE SODIUM 8.6-50 MG PO TABS: 1.0000 | ORAL_TABLET | Freq: Two times a day (BID) | ORAL | Status: AC

## 2016-10-26 MED ORDER — POLYETHYLENE GLYCOL 3350 17 G PO PACK: 17.0000 g | PACK | Freq: Every day | ORAL | 0 refills | Status: AC | PRN

## 2016-10-26 NOTE — Clinical Social Work Note (Signed)
Per MD pt needs to d/c today to [facility] (placement chosen by pt and pt's family). [facility] is pending because [place], requesting a in state [facility]. [facility] is closed on the weekends therefore unable to contact. MD has provided a 30 day note, CSW will fax once requested by [facility]. CSW spoke with [name] at [place] [facility] via text and at 10:50 she stated "that will be fine", when CSW asked if she could send pt today and follow up with facility when [facility] is received. MD notified.  [address] [place]

## 2016-10-26 NOTE — Discharge Summary (Signed)
Physician Discharge Summary   Patient ID: Rhonda Snyder MRN: [id number] DOB/AGE: 01/01/1931 83 y.o.  Admit date: 10/21/2016 Discharge date: 10/26/2016  Primary Care Physician:  No PCP Per Patient  Discharge Diagnoses:    . Closed fracture of single pubic ramus of pelvis ([place]) . Hypothyroidism, acquired . CKD (chronic kidney disease), stage III . Dysuria Constipation   Consults:  None  Recommendations for Outpatient Follow-up:  1. Continue physical therapy 2. Please repeat CBC/BMET at next visit   DIET: Heart healthy diet    Allergies:   Allergies  Allergen Reactions  . Penicillins Rash     DISCHARGE MEDICATIONS: Current Discharge Medication List    START taking these medications   Details  diazepam (VALIUM) 2 MG tablet Take 1 tablet (2 mg total) by mouth every 8 (eight) hours as needed for muscle spasms. Qty: 30 tablet, Refills: 0    oxyCODONE (OXY IR/ROXICODONE) 5 MG immediate release tablet Take 1 tablet (5 mg total) by mouth every 4 (four) hours as needed for moderate pain or severe pain. Qty: 20 tablet, Refills: 0    polyethylene glycol (MIRALAX / GLYCOLAX) packet Take 17 g by mouth daily as needed for moderate constipation. Qty: 14 each, Refills: 0    senna-docusate (SENOKOT-S) 8.6-50 MG tablet Take 1 tablet by mouth 2 (two) times daily.      CONTINUE these medications which have CHANGED   Details  acetaminophen (TYLENOL) 500 MG chewable tablet Chew 1 tablet (500 mg total) by mouth 3 (three) times daily. Qty: 30 tablet, Refills: 0      CONTINUE these medications which have NOT CHANGED   Details  buPROPion (WELLBUTRIN XL) 300 MG 24 hr tablet Take 300 mg by mouth daily.    Calcium-Magnesium-Vitamin D (CALCIUM 1200+D3 PO) Take 1 tablet by mouth 2 (two) times daily.    escitalopram (LEXAPRO) 10 MG tablet Take 10 mg by mouth daily.    exemestane (AROMASIN) 25 MG tablet Take 25 mg by mouth every evening.    levothyroxine (SYNTHROID, LEVOTHROID)  137 MCG tablet Take 137 mcg by mouth daily before breakfast.    Multiple Vitamin (MULTIVITAMIN WITH MINERALS) TABS tablet Take 1 tablet by mouth daily.    nitrofurantoin (MACRODANTIN) 50 MG capsule Take 50 mg by mouth at bedtime.      STOP taking these medications     HYDROcodone-acetaminophen (NORCO/VICODIN) 5-325 MG tablet          Brief H and P: For complete details please refer to admission H and P, but in brief Patient is 83 year old female with partial processes, recurrent UTI, hypothyroidism and recurrent falls presented with fall and hip pain. Patient is visiting her daughter from [place]. She had a mechanical fall and afterwards had left hip and pelvic pain and difficulty walking.  In ED, CT head and C-spine were unremarkable. Radiographs of the left hip showed pubic rami fractures, nondisplaced. Patient lives in [place] alone and has an aide who comes in 5 nights/ week and a housekeeper. Patient has numerous falls resulting in fractures. Patient also reported new onset low back pain with dysuria typical of her UTI.   Hospital Course:   Closed fracture of single pubic ramus of pelvis ([place]) - Patient was placed on conservative management with antispasmodic, pain control and physical therapy - Cont pain control, physical therapy and bowel regimen with stool softeners and laxatives   Fever - one Isolated incident, no more fevers, UA negative for UTI, no leukocytosis, no signs or symptoms of any  infection. - Blood culture showed coag is negative staph likely contaminant    Hypothyroidism, acquired - Continue Synthroid    CKD (chronic kidney disease), stage III - Creatinine at baseline, 0.98 at discharge   Constipation Placed on bowel regimen with MiraLAX and Senokot-S   Day of Discharge BP 133/63 (BP Location: Left Arm)   Pulse 77   Temp 98.5 F (36.9 C) (Oral)   Resp 18   Ht 5\' 7"  (1.702 m)   Wt 70.3 kg (155 lb)   SpO2 94%   BMI 24.28 kg/m   Physical  Exam: General: Alert and awake oriented x3 not in any acute distress. HEENT: anicteric sclera, pupils reactive to light and accommodation CVS: S1-S2 clear no murmur rubs or gallops Chest: clear to auscultation bilaterally, no wheezing rales or rhonchi Abdomen: soft nontender, nondistended, normal bowel sounds Extremities: no cyanosis, clubbing or edema noted bilaterally Neuro: Cranial nerves II-XII intact, no focal neurological deficits   The results of significant diagnostics from this hospitalization (including imaging, microbiology, ancillary and laboratory) are listed below for reference.    LAB RESULTS: Basic Metabolic Panel:  Recent Labs Lab 10/25/16 1458 10/26/16 0447  NA 139 142  K 4.2 4.6  CL 105 105  CO2 27 27  GLUCOSE 106* 85  BUN 17 19  CREATININE 1.03* 0.98  CALCIUM 8.5* 9.1   Liver Function Tests: No results for input(s): AST, ALT, ALKPHOS, BILITOT, PROT, ALBUMIN in the last 168 hours. No results for input(s): LIPASE, AMYLASE in the last 168 hours. No results for input(s): AMMONIA in the last 168 hours. CBC:  Recent Labs Lab 10/22/16 0042 10/25/16 1458 10/26/16 0447  WBC 9.0 5.9 4.7  NEUTROABS 7.8*  --   --   HGB 12.9 10.2* 11.0*  HCT 39.5 32.0* 34.4*  MCV 101.0* 102.9* 103.6*  PLT 209 158 177   Cardiac Enzymes: No results for input(s): CKTOTAL, CKMB, CKMBINDEX, TROPONINI in the last 168 hours. BNP: Invalid input(s): POCBNP CBG: No results for input(s): GLUCAP in the last 168 hours.  Significant Diagnostic Studies:  Ct Head Wo Contrast  Result Date: 10/22/2016 CLINICAL DATA:  Post unwitnessed fall today with headache. EXAM: CT HEAD WITHOUT CONTRAST CT CERVICAL SPINE WITHOUT CONTRAST TECHNIQUE: Multidetector CT imaging of the head and cervical spine was performed following the standard protocol without intravenous contrast. Multiplanar CT image reconstructions of the cervical spine were also generated. COMPARISON:  None. FINDINGS: CT HEAD FINDINGS  Brain: No evidence of acute infarction, hemorrhage, hydrocephalus, extra-axial collection or mass lesion/mass effect. Moderate generalized atrophy. Mild chronic small vessel ischemia. Vascular: No hyperdense vessel or unexpected calcification. Skull: Normal. Negative for fracture or focal lesion. Sinuses/Orbits: Bilateral cataract resection. Orbits are otherwise unremarkable. Paranasal sinuses and mastoid air cells well-aerated. Other: None. CT CERVICAL SPINE FINDINGS Alignment: Minimal degenerative-type anterolisthesis of C4 on C5. Alignment is otherwise maintained. Skull base and vertebrae: No acute fracture. No primary bone lesion or focal pathologic process. Chronic degenerative change at C1-C2 articulation. Soft tissues and spinal canal: No prevertebral fluid or swelling. No visible canal hematoma. Disc levels: Disc space narrowing and endplate spurring most prominent at C5-C6 and C6-C7. There is multifocal facet arthropathy. Multilevel neural foraminal stenosis. Upper chest: No acute abnormality. Mild biapical pleuroparenchymal scarring. Other: Carotid vascular calcifications, left greater than right. IMPRESSION: 1. No acute intracranial abnormality or skull fracture. Generalized atrophy and chronic small vessel ischemia. 2. Degenerative change in the cervical spine without acute fracture or subluxation. Electronically Signed   By: Threasa Beards  Ehinger M.D.   On: 10/22/2016 00:34   Ct Cervical Spine Wo Contrast  Result Date: 10/22/2016 CLINICAL DATA:  Post unwitnessed fall today with headache. EXAM: CT HEAD WITHOUT CONTRAST CT CERVICAL SPINE WITHOUT CONTRAST TECHNIQUE: Multidetector CT imaging of the head and cervical spine was performed following the standard protocol without intravenous contrast. Multiplanar CT image reconstructions of the cervical spine were also generated. COMPARISON:  None. FINDINGS: CT HEAD FINDINGS Brain: No evidence of acute infarction, hemorrhage, hydrocephalus, extra-axial collection  or mass lesion/mass effect. Moderate generalized atrophy. Mild chronic small vessel ischemia. Vascular: No hyperdense vessel or unexpected calcification. Skull: Normal. Negative for fracture or focal lesion. Sinuses/Orbits: Bilateral cataract resection. Orbits are otherwise unremarkable. Paranasal sinuses and mastoid air cells well-aerated. Other: None. CT CERVICAL SPINE FINDINGS Alignment: Minimal degenerative-type anterolisthesis of C4 on C5. Alignment is otherwise maintained. Skull base and vertebrae: No acute fracture. No primary bone lesion or focal pathologic process. Chronic degenerative change at C1-C2 articulation. Soft tissues and spinal canal: No prevertebral fluid or swelling. No visible canal hematoma. Disc levels: Disc space narrowing and endplate spurring most prominent at C5-C6 and C6-C7. There is multifocal facet arthropathy. Multilevel neural foraminal stenosis. Upper chest: No acute abnormality. Mild biapical pleuroparenchymal scarring. Other: Carotid vascular calcifications, left greater than right. IMPRESSION: 1. No acute intracranial abnormality or skull fracture. Generalized atrophy and chronic small vessel ischemia. 2. Degenerative change in the cervical spine without acute fracture or subluxation. Electronically Signed   By: Jeb Levering M.D.   On: 10/22/2016 00:34   Dg Hip Unilat W Or Wo Pelvis 2-3 Views Left  Result Date: 10/22/2016 CLINICAL DATA:  Fall today with left hip/ groin pain. EXAM: DG HIP (WITH OR WITHOUT PELVIS) 2-3V LEFT COMPARISON:  None. FINDINGS: Intramedullary rod with proximal screw fixation of both hips. The hardware is intact. No periprosthetic fracture. Both femoral heads appear seated in the acetabulum. Cortical irregularity of the left superior pubic ramus adjacent to the pubic body. Probable nondisplaced left inferior pubic ramus fracture. Pubic symphysis remains congruent. The bones are under mineralized. IMPRESSION: Suspect acute nondisplaced left superior  and inferior pubic rami fractures. Electronically Signed   By: Jeb Levering M.D.   On: 10/22/2016 00:20    2D ECHO:   Disposition and Follow-up: Discharge Instructions    Diet - low sodium heart healthy    Complete by:  As directed    Increase activity slowly    Complete by:  As directed        DISPOSITION: Skilled nursing facility for rehabilitation   [facility] Follow up.   Specialty:  [place] information: [place] [id number] [phone]            Time spent on Discharge: 25 minutes  Signed:   Analilia Geddis M.D. [facility] 10/26/2016, 11:11 AM Pager: [phone]

## 2016-10-26 NOTE — Clinical Social Work Note (Signed)
Clinical Social Worker facilitated patient discharge including contacting patient family and facility to confirm patient discharge plans.  Clinical information faxed to facility and family agreeable with plan.  CSW arranged ambulance transport via [facility] to [place].  RN to call [phone] for report prior to discharge. Patient is going to room 219.  Clinical Social Worker will sign off for now as social work intervention is no longer needed. Please consult [place] again if new need arises.  [address], [place]

## 2016-10-26 NOTE — Progress Notes (Signed)
Patient ready for d/c to [facility], IV removed, VSS, family at bedside, report called to [facility] and given to nurse there. Patient belongings all together. Awaiting transport who was arranged to pick up at 1500.

## 2016-10-26 NOTE — Clinical Social Work Note (Signed)
Clinical Social Work Assessment  Patient Details  Name: Rhonda Snyder MRN: [id number] Date of Birth: 01-02-1931  Date of referral:  10/26/16               Reason for consult:  Facility Placement                Permission sought to share information with:  Family Supports Permission granted to share information::  Yes, Verbal Permission Granted  Name::     Rhonda Snyder  Agency::     Relationship::  Daughter  Contact Information:  [phone]  Housing/Transportation Living arrangements for the past 2 months:  [place] of Information:  Patient Patient Interpreter Needed:  None Criminal Activity/Legal Involvement Pertinent to Current Situation/Hospitalization:  No - Comment as needed Significant Relationships:  Adult Children Lives with:  Self Do you feel safe going back to the place where you live?  Yes Need for family participation in patient care:     Care giving concerns:  No family or friends at bedside during initial assessment. Pt gave CSW verbal permission to contact daughter.   Social Worker assessment / plan:  CSW spoke with pt at bedside to complete initial assessment. Pt lives at home alone in [place]. Pt has another daughter in [place]. Pt was in [place] visiting for Christmas prior to admission. Pt wants to go to SNF here in order for her daughter to be able to visit her. After rehab pt plans to move back to [place] if physical able. Pt is agreeable to SNF placement here in [place]. Pt's daughter has called [facility] and spoke with them about the pt coming there at d/c. Per Rhonda Snyder at [facility] she will take pt today; requesting afternoon admission. At this time pt's PASRR is under review; requesting in state PASRR with a out of state address. Per Rhonda Snyder facility will take pt today and CSW can contact facility once PASRR is received next week. CSW will contact pt's daughter and set up [facility] for afternoon pickup.  Employment status:   Retired Forensic scientist:  Medicare PT Recommendations:  Rhonda Snyder / Referral to community resources:  Rhonda Snyder  Patient/Family's Response to care:  Pt verbalized understanding of CSW role and expressed appreciation for support. Pt denies any concern regarding pt care at this time.  Patient/Family's Understanding of and Emotional Response to Diagnosis, Current Treatment, and Prognosis:  Pt understanding and realistic regarding physical limitations. Pt understanding and agreeable to SNF placement at d/c. Pt prefers Rhonda Snyder. At this time Rhonda Snyder will admit pt. Pt denies any concern regarding treatment plan or disposition at this time. Pt response emotionally appropriate during conversation with CSW. CSW will continue to provide support. Pt plan is to d/c today to Rhonda Snyder.  Emotional Assessment Appearance:  Appears stated age Attitude/Demeanor/Rapport:   (Patient was appropriate.) Affect (typically observed):  Accepting, Appropriate, Calm Orientation:  Oriented to Self, Oriented to  Time, Oriented to Place, Oriented to Situation Alcohol / Substance use:  Not Applicable Psych involvement (Current and /or in the community):  No (Comment)  Discharge Needs  Concerns to be addressed:  No discharge needs identified Readmission within the last 30 days:  No Current discharge risk:  Dependent with Mobility Barriers to Discharge:  Continued Medical Work up   [employer], LCSW 10/26/2016, 11:06 AM

## 2016-10-26 NOTE — Clinical Social Work Placement (Signed)
   CLINICAL SOCIAL WORK PLACEMENT  NOTE  Date:  10/26/2016  Patient Details  Name: Rhonda Snyder MRN: [id number] Date of Birth: June 25, 1931  Clinical Social Work is seeking post-discharge placement for this patient at the [place] level of care (*CSW will initial, date and re-position this form in  chart as items are completed):      Patient/family provided with [place] Work Department's list of facilities offering this level of care within the geographic area requested by the patient (or if unable, by the patient's family).  Yes   Patient/family informed of their freedom to choose among providers that offer the needed level of care, that participate in Medicare, Medicaid or managed care program needed by the patient, have an available bed and are willing to accept the patient.      Patient/family informed of [facility]'s ownership interest in [facility] and [facility], as well as of the fact that they are under no obligation to receive care at these facilities.  PASRR submitted to [employer]DS on       PASRR number received on       Existing PASRR number confirmed on       FL2 transmitted to all facilities in geographic area requested by pt/family on       FL2 transmitted to all facilities within larger geographic area on       Patient informed that his/her managed care company has contracts with or will negotiate with certain facilities, including the following:        Yes   Patient/family informed of bed offers received.  Patient chooses bed at [facility] recommends and patient chooses bed at      Patient to be transferred to [facility] on 10/26/16.  Patient to be transferred to facility by [facility]     Patient family notified on 10/26/16 of transfer.  Name of family member notified:  [name]     PHYSICIAN Please prepare priority discharge summary, including medications, Please prepare  prescriptions     Additional Comment:    _______________________________________________ Alla German, LCSW 10/26/2016, 11:12 AM

## 2016-10-27 LAB — CULTURE, BLOOD (ROUTINE X 2)

## 2016-10-29 ENCOUNTER — Non-Acute Institutional Stay (SKILLED_NURSING_FACILITY): Payer: Medicare Other | Admitting: Internal Medicine

## 2016-10-29 ENCOUNTER — Encounter: Payer: Self-pay | Admitting: Internal Medicine

## 2016-10-29 DIAGNOSIS — S32592D Other specified fracture of left pubis, subsequent encounter for fracture with routine healing: Secondary | ICD-10-CM | POA: Diagnosis not present

## 2016-10-29 DIAGNOSIS — R296 Repeated falls: Secondary | ICD-10-CM

## 2016-10-29 LAB — CULTURE, BLOOD (ROUTINE X 2): Culture: NO GROWTH

## 2016-10-29 NOTE — Assessment & Plan Note (Signed)
Ophthalmology update recommended on return home. Rule out issues with depth perception. Isometrics before standing discussed with her because of relatively low blood pressure.   Marland Kitchen

## 2016-10-29 NOTE — Assessment & Plan Note (Signed)
PT/OT to improve strength and balance

## 2016-10-29 NOTE — Patient Instructions (Signed)
See Orders  & Recommendations under each diagnosis

## 2016-10-29 NOTE — Progress Notes (Signed)
Facility Location: [facility]  Room Number: 219  Code Status:   PCP:Kim owens, MD, [place] ,[place]  This is a comprehensive admission note to [facility] performed on this date less than 30 days from date of admission. Included are preadmission medical/surgical history;reconciled medication list; family history; social history and comprehensive review of systems.  Corrections and additions to the records were documented . Comprehensive physical exam was also performed. Additionally a clinical summary was entered for each active diagnosis pertinent to this admission in the Problem List to enhance continuity of care.  HPI: The patient was hospitalized 1/15-1/20/18 for  treatment of a closed fracture of the left superior and inferior pubic ramus of the pelvis sustained in a fall . By history she has had recurrent falls.This fall was described as mechanical, occuring when she tripped over a piece of luggage, and not related to cardiac or neurologic prodrome. The patient was in this area visiting her daughter. She lives alone in [place] but has an aide who comes in 5 nights per week as well as a Secretary/administrator. Hospital course was associated with isolated fever without recurrence. Blood culture revealed coag negative staph suggested to be a contaminant. Urinalysis revealed no leukocytes. She is on nitrofurantoin prophylactically. At discharge mild but improving anemia was documented. Heme 11.0 and hematocrit 34.4. GFR was minimally reduced at 52.  Past medical and surgical history: The latest fall represents at least the fifth such event. She's had sutures in her scalp twice and fractured both hips. She describes all of these as mechanical falls without any cardiac or neurologic prodrome. She states that she missed a step on one occasion and on at least 2 other occasions tripped over a rug or an item such as luggage. She has not had an ophthalmologic  exam for 2 years, she denies active ophthalmologic symptoms.  She denies any postural hypotension symptoms.  PMH also includes sleep apnea without CPAP use,recurrent UTIs, osteoporosis, history of migraine, hypothyroidism, fibromyalgia, chronic kidney disease, depression, and history of breast cancer. Surgical history includes total knee arthroplasty, bilateral hip fracture surgery, cholecystectomy and lumpectomy.  Social history: smoked 30 pack years, 3 glasses of wine per week.  Family history: Reviewed  Review of systems: She states that her muscle spasms have stopped and she does not need the Valium ordered as needed. She has anxiety/depression and is on Lexapro as well as Wellbutrin. She has been on oxycodone at bedtime for chronic pain.. Constitutional: No fever,significant weight change, fatigue  Eyes: No redness, discharge, pain, vision change ENT/mouth: No nasal congestion,  purulent discharge, earache,change in hearing ,sore throat  Cardiovascular: No chest pain, palpitations,paroxysmal nocturnal dyspnea, claudication, edema  Respiratory: No cough, sputum production,hemoptysis, DOE , significant snoring,apnea  Gastrointestinal: No heartburn,dysphagia,abdominal pain, nausea / vomiting,rectal bleeding, melena,change in bowels Genitourinary: No dysuria,hematuria, pyuria,  incontinence, nocturia Musculoskeletal: No joint stiffness, joint swelling, weakness Dermatologic: No rash, pruritus, change in appearance of skin Neurologic: No dizziness,headache,syncope, seizures, numbness , tingling Endocrine: No change in hair/skin/ nails, excessive thirst, excessive hunger, excessive urination  Hematologic/lymphatic: No significant bruising, lymphadenopathy,abnormal bleeding Allergy/immunology: No itchy/ watery eyes, significant sneezing, urticaria, angioedema  Physical exam:  Pertinent or positive findings: She appears younger than her stated age. She has slight ptosis. Arcus senilis is  present. Dental hygiene is exceptionally good with what appears to be implants. Pedal pulses are decreased. Trace pedal edema. There is accentuation of the thoracic curvature.  General appearance:Thin but adequately nourished; no acute  distress , increased work of breathing is present.   Lymphatic: No lymphadenopathy about the head, neck, axilla . Eyes: No conjunctival inflammation or lid edema is present. There is no scleral icterus. Ears:  External ear exam shows no significant lesions or deformities.   Nose:  External nasal examination shows no deformity or inflammation. Nasal mucosa are pink and moist without lesions ,exudates Oral exam: lips and gums are healthy appearing.There is no oropharyngeal erythema or exudate . Neck:  No thyromegaly, masses, tenderness noted.    Heart:  Normal rate and regular rhythm. S1 and S2 normal without gallop, murmur, click, rub .  Lungs:Chest clear to auscultation without wheezes, rhonchi,rales , rubs. Abdomen:Bowel sounds are normal. Abdomen is soft and nontender with no organomegaly, hernias,masses. GU: deferred  Extremities:  No cyanosis, clubbing  Neurologic exam : Balance,Rhomberg,finger to nose testing could not be completed due to clinical state Skin: Warm & dry w/o tenting. No significant lesions or rash.  See clinical summary under each active problem in the Problem List with associated updated therapeutic plan

## 2016-10-30 ENCOUNTER — Other Ambulatory Visit: Payer: Self-pay | Admitting: *Deleted

## 2016-10-30 MED ORDER — OXYCODONE HCL 5 MG PO TABS: 5.0000 mg | ORAL_TABLET | ORAL | 0 refills | Status: DC | PRN

## 2016-10-30 NOTE — Telephone Encounter (Signed)
[facility] [phone] Fax: [phone]  

## 2016-11-12 ENCOUNTER — Other Ambulatory Visit (HOSPITAL_COMMUNITY): Payer: Self-pay | Admitting: Internal Medicine

## 2016-11-12 ENCOUNTER — Ambulatory Visit (HOSPITAL_COMMUNITY)
Admission: RE | Admit: 2016-11-12 | Discharge: 2016-11-12 | Disposition: A | Discharge status: Home / Self Care | Payer: Federal, State, Local not specified - PPO | Source: Ambulatory Visit | Attending: Internal Medicine | Admitting: Internal Medicine

## 2016-11-12 DIAGNOSIS — T148XXA Other injury of unspecified body region, initial encounter: Secondary | ICD-10-CM

## 2016-11-12 DIAGNOSIS — S32512A Fracture of superior rim of left pubis, initial encounter for closed fracture: Secondary | ICD-10-CM | POA: Diagnosis not present

## 2016-11-12 DIAGNOSIS — S3289XA Fracture of other parts of pelvis, initial encounter for closed fracture: Secondary | ICD-10-CM | POA: Diagnosis present

## 2016-11-14 ENCOUNTER — Non-Acute Institutional Stay (SKILLED_NURSING_FACILITY): Payer: Medicare Other | Admitting: Internal Medicine

## 2016-11-14 ENCOUNTER — Encounter: Payer: Self-pay | Admitting: Internal Medicine

## 2016-11-14 DIAGNOSIS — G8929 Other chronic pain: Secondary | ICD-10-CM | POA: Diagnosis not present

## 2016-11-14 DIAGNOSIS — S32592D Other specified fracture of left pubis, subsequent encounter for fracture with routine healing: Secondary | ICD-10-CM | POA: Diagnosis not present

## 2016-11-14 DIAGNOSIS — Z9189 Other specified personal risk factors, not elsewhere classified: Secondary | ICD-10-CM

## 2016-11-14 DIAGNOSIS — M545 Low back pain, unspecified: Secondary | ICD-10-CM

## 2016-11-14 DIAGNOSIS — R296 Repeated falls: Secondary | ICD-10-CM

## 2016-11-14 NOTE — Progress Notes (Signed)
   [facility] Room: 219  PCP:Dr Kim,[place]  This is a nursing facility follow up for specific acute issue of persistent pelvic girdle pain  Interim medical record and care since last [place] visit was updated with review of diagnostic studies and change in clinical status since last visit were documented.  HPI: The patient describes left inguinal area pain which radiates to the left buttocks and down the left leg to the knee anteriorly. Pain is described as sharp and progressive and can last minutes-hours. This is associated with spasms in the left leg It is worse with PT/OT. It is improved with oxycodone. She was admitted to the SNF following closed fracture of the pubic rami. X-ray at [facility] 11/12/16 revealed routine healing with no other lesions. She has had recurrent mechanical falls by history. The falls have been associated with compression fractures in the spine and fractures of both hips. Significant past history includes fibromyalgia, depression, chronic back pain, osteoporosis, and sleep apnea. She has had bilateral hip fracture repair and right total knee arthroplasty. She has had a history of breast cancer. She quit smoking 1972. She had been drinking 3 glasses of wine per week   Review of systems: She has intermittent urinary incontinence. She denies any stool incontinence other neurologic complications. She states that her feet have turned blue intermittently for well over a decade. She denies a diagnosis of Raynaud's phenomena. No dizziness,headache,syncope, seizures, numbness , tingling  Physical exam:  Pertinent or positive findings: Ptosis present on the left. Arcus senilis is present. She has dental implants which are impeccable. Grade 1/2 systolic murmur at the left base. Pedal pulses are decreased. 1/2+ edema at the sock line. The feet are purplish and cool. She has flexion contractures of toes.  General  appearance:Adequately nourished; no acute distress , increased work of breathing is present.   Lymphatic: No lymphadenopathy about the head, neck, axilla . Eyes: No conjunctival inflammation or lid edema is present. There is no scleral icterus. Ears:  External ear exam shows no significant lesions or deformities.   Nose:  External nasal examination shows no deformity or inflammation. Nasal mucosa are pink and moist without lesions ,exudates Oral exam: lips and gums are healthy appearing.There is no oropharyngeal erythema or exudate . Neck:  No thyromegaly, masses, tenderness noted.    Heart:  Normal rate and regular rhythm. S1 and S2 normal without gallop, click, rub .  Lungs:Chest clear to auscultation without wheezes, rhonchi,rales , rubs. Abdomen:Bowel sounds are normal. Abdomen is soft and nontender with no organomegaly, hernias,masses. GU: deferred  Extremities:  No clubbing Skin: Warm & dry w/o tenting. No significant lesions or rash.    See summary under each active problem in the Problem List with associated updated therapeutic plan

## 2016-11-14 NOTE — Assessment & Plan Note (Signed)
Increasing the narcotic dose is likely to exacerbate her risk of falling

## 2016-11-14 NOTE — Patient Instructions (Addendum)
See assessment and plan under each diagnosis in the problem list and acutely for this visit Total time  40 minutes; greater than 50% of the visit spent counseling patient and coordinating care for problems addressed at this encounter

## 2016-11-14 NOTE — Assessment & Plan Note (Signed)
Titrate gabapentin based on response Consider changing generic Lexapro to Cymbalta if neuropathic pain persists

## 2016-11-14 NOTE — Assessment & Plan Note (Addendum)
Increasing narcotic dose is medically contraindicated because of the risks as documented and discussed with the patient in detail using charts

## 2016-11-14 NOTE — Assessment & Plan Note (Addendum)
11/12/16 imaging revealed routine healing with no acute process Pain described 2/6 is lumbosacral neuropathic pain most likely related to previous compression fractures

## 2016-11-26 ENCOUNTER — Non-Acute Institutional Stay (SKILLED_NURSING_FACILITY): Payer: Medicare Other | Admitting: Internal Medicine

## 2016-11-26 DIAGNOSIS — E038 Other specified hypothyroidism: Secondary | ICD-10-CM | POA: Diagnosis not present

## 2016-11-26 DIAGNOSIS — R260 Ataxic gait: Secondary | ICD-10-CM | POA: Diagnosis not present

## 2016-11-26 DIAGNOSIS — M810 Age-related osteoporosis without current pathological fracture: Secondary | ICD-10-CM | POA: Diagnosis not present

## 2016-11-26 NOTE — Progress Notes (Signed)
11/26/16 Facility; [facility] SN[facility]  Chief complaint; review requested by the facility with regards to medication adjustments, other medical issues  History; this is a patient who suffered a fall while packing for return to her home in [place] the day after Christmas. She states she tripped over luggage. She suffered a left superior and left inferior pubic rami fracture. The patient has a history of serious falls including a fall in November 17 at which time she suffered a subdural hematoma, she suffered a right hip fracture in March 2017. Looking through her notes and care everywhere she has had intensive physical therapy for gait imbalance issues. Her daughter tells me on the phone tonight that actually her gait is a lot better now than it was before the fractures. The patient seems to concur with this thought.  The other issue is that apparently she was started on Norvasc on her admission by Dr. Linna Darner with regards to a concern about the possibility of Raynaud's phenomenon. I cannot really find the issue here looking through the records. The patient seems to think this was because her lower legs and feet are blue when they're dependent. However I really can't find the exact justification. The patient historically has a somewhat low blood pressure and her daughter became quite upset about the Norvasc. I was called about this yesterday and I see that the Norvasc was discontinued by our service over the phone today. In my conversation with her daughter she seems satisfied with this.  Past Medical History:  Diagnosis Date  . Arthritis    "qwhere" (10/22/2016)  . Cancer of right breast ([place]) ~ 2013  . Chronic back pain   . Chronic kidney disease, stage 3   . Depression   . Family history of adverse reaction to anesthesia    "my mother did; think her kidneys stopped working" (10/22/2016)  . Fibromyalgia   . Hypothyroidism   . Migraine    "last one was ~ 2014" (10/22/2016)  . Osteoporosis   .  Recurrent UTI   . Sleep apnea    "don't wear a mask" (10/22/2016)   Current medications Oxycodone 5 mg 1 tablet by mouth every 8 hours when necessary for pain Calcium-magnesium-vitamin D 1 tablet 2 times a day Synthroid 137 g per day Well butyrin XL 300 mg daily Exemestane 25 mg daily Neurontin 50 g daily for recurrent UTIs Escitalopram 10 mg daily  CMP     Component Value Date/Time   NA 142 10/26/2016 0447   K 4.6 10/26/2016 0447   CL 105 10/26/2016 0447   CO2 27 10/26/2016 0447   GLUCOSE 85 10/26/2016 0447   BUN 19 10/26/2016 0447   CREATININE 0.98 10/26/2016 0447   CALCIUM 9.1 10/26/2016 0447   GFRNONAA 52 (L) 10/26/2016 0447   GFRAA >60 10/26/2016 0447   CBC Latest Ref Rng & Units 10/26/2016 10/25/2016 10/22/2016  WBC 4.0 - 10.5 K/uL 4.7 5.9 9.0  Hemoglobin 12.0 - 15.0 g/dL 11.0(L) 10.2(L) 12.9  Hematocrit 36.0 - 46.0 % 34.4(L) 32.0(L) 39.5  Platelets 150 - 400 K/uL 177 158 209    Social history; patient lives in [place]. She has her own home which is apparently small and well laid out. She has in-home assistance and a family member who lives close by. She uses a walker and is fairly compliant with this.  Review of systems HEENT; no visual issues Cardiac no syncope no orthostatic symptoms GI states she is eating well. GU probable urge urinary incontinence Musculoskeletal;  fairly widespread pain including the left pelvis, left knee Peripheral vascular; her peripheral pulses are all well at the dorsalis pedis and posterior tibial. Dependent her legs do changed color in the feet but this is probably venous insufficiency Neurologic notable for an essential tremor, she has been seen by a neurologist. Endocrine she is on Synthroid but I don't see a TSH level. This probably should be checked.  Physical examination Gen. pleasant 83 year old woman in no distress. Respiratory clear entry bilaterally Cardiac heart sounds are normal no murmurs no gallops Musculoskeletal;  degenerative changes in the left knee, she's had a right total knee replacement which is somewhat unstable. Neurologic; no pronator drift. This strength in her pelvic girdle including hip flexors hip adductors is 4+ out of 5. She has a essential tremor in her bilateral hands this does not look to be disabling. Gait; the patient is able to bring herself to a sitting position in bed and into standing with her walker independently. Her gait is abnormal likely to be multifactorial partially antalgic. Valgus at the right knee tends to drag the left foot. Mental status; I didn't see major issues with depression here. Her cognition seems intact    Impression/plan #1 left sided pelvic fractures and a patient with osteoporosis. She is not on an aggressive regimen for osteoporosis. She has been on Prolia #2 at one point in time this patient was vitamin D deficient, probably wise to repeat this #3 she has a macrocytic anemia and at one point was on vitamin B12 replacement. I think this probably should be repeated. #4 hypothyroidism on replacement. Given everything that is happened to this patient I could TSH is indicated. #5 depression; her daughter states that she sees this is stable #6 gait ataxia likely to be multifactorial, per the daughter this seems to be improving with our therapy department

## 2016-11-27 LAB — CBC AND DIFFERENTIAL
HCT: 38 % (ref 36–46)
HCT: 38 % (ref 36–46)
HEMOGLOBIN: 12.2 g/dL (ref 12.0–16.0)
Hemoglobin: 12.2 g/dL (ref 12.0–16.0)
PLATELETS: 176 10*3/uL (ref 150–399)
PLATELETS: 176 10*3/uL (ref 150–399)
WBC: 5 10*3/mL
WBC: 5 10^3/mL

## 2016-11-27 LAB — TSH: TSH: 23.08 u[IU]/mL — AB (ref 0.41–5.90)

## 2016-11-27 LAB — VITAMIN D 25 HYDROXY (VIT D DEFICIENCY, FRACTURES)
Vit D, 25-Hydroxy: 23.91
Vit D, 25-Hydroxy: 23.91

## 2016-11-27 LAB — VITAMIN B12: VITAMIN B 12: 423

## 2016-12-04 ENCOUNTER — Encounter: Payer: Self-pay | Admitting: Nurse Practitioner

## 2016-12-04 ENCOUNTER — Non-Acute Institutional Stay (SKILLED_NURSING_FACILITY): Payer: Medicare Other | Admitting: Nurse Practitioner

## 2016-12-04 DIAGNOSIS — S32592D Other specified fracture of left pubis, subsequent encounter for fracture with routine healing: Secondary | ICD-10-CM | POA: Diagnosis not present

## 2016-12-04 DIAGNOSIS — M810 Age-related osteoporosis without current pathological fracture: Secondary | ICD-10-CM

## 2016-12-04 DIAGNOSIS — F32A Depression, unspecified: Secondary | ICD-10-CM

## 2016-12-04 DIAGNOSIS — F329 Major depressive disorder, single episode, unspecified: Secondary | ICD-10-CM | POA: Diagnosis not present

## 2016-12-04 DIAGNOSIS — E038 Other specified hypothyroidism: Secondary | ICD-10-CM

## 2016-12-04 DIAGNOSIS — R260 Ataxic gait: Secondary | ICD-10-CM

## 2016-12-04 DIAGNOSIS — E559 Vitamin D deficiency, unspecified: Secondary | ICD-10-CM

## 2016-12-04 MED ORDER — ESCITALOPRAM OXALATE 10 MG PO TABS: 10.0000 mg | ORAL_TABLET | Freq: Every day | ORAL | 0 refills | Status: AC

## 2016-12-04 MED ORDER — BUPROPION HCL ER (XL) 300 MG PO TB24: 300.0000 mg | ORAL_TABLET | Freq: Every day | ORAL | 0 refills | Status: AC

## 2016-12-04 MED ORDER — NITROFURANTOIN MACROCRYSTAL 50 MG PO CAPS: 50.0000 mg | ORAL_CAPSULE | Freq: Every day | ORAL | 0 refills | Status: AC

## 2016-12-04 MED ORDER — VITAMIN D (ERGOCALCIFEROL) 1.25 MG (50000 UNIT) PO CAPS: 50000.0000 [IU] | ORAL_CAPSULE | ORAL | 0 refills | Status: AC

## 2016-12-04 MED ORDER — EXEMESTANE 25 MG PO TABS: 25.0000 mg | ORAL_TABLET | Freq: Every evening | ORAL | 0 refills | Status: AC

## 2016-12-04 MED ORDER — LEVOTHYROXINE SODIUM 150 MCG PO TABS: 150.0000 ug | ORAL_TABLET | Freq: Every day | ORAL | 0 refills | Status: DC

## 2016-12-04 NOTE — Progress Notes (Signed)
Nursing Home Location:  [facility]  Place of Service: SNF (31)  PCP: No PCP Per Patient  Allergies  Allergen Reactions  . Penicillins Rash    Chief Complaint  Patient presents with  . Discharge Note    Resident is being discharged from nursing facility.     HPI:  Patient is a 83 y.o. female seen today at [facility] for discharge home. Pt suffered a fall on a suitcase at her daughters house while packing to go home. She suffered a left superior and left inferior pubic rami fracture. The patient has a history of serious falls including a fall in November 17 at which time she suffered a subdural hematoma, she suffered a right hip fracture in March 2017. She has had physical therapy for gait imbalance issues and has improved. Pt reports pain is much better. Taking oxycodone twice daily. No constipation reported. Patient currently doing well with therapy, now stable to discharge home with home health. Plans to stay with daughter in [place] for 10 days then planning to go back home to [place] where she does have family and support.   Review of Systems:  Review of Systems  Constitutional: Negative for activity change, appetite change, fatigue and unexpected weight change.  HENT: Negative for congestion and hearing loss.   Eyes: Negative.   Respiratory: Negative for cough and shortness of breath.   Cardiovascular: Negative for chest pain, palpitations and leg swelling.  Gastrointestinal: Negative for abdominal pain, constipation and diarrhea.  Genitourinary: Negative for difficulty urinating and dysuria.  Musculoskeletal: Positive for gait problem (using walker). Negative for arthralgias and myalgias.  Skin: Negative for color change and wound.  Neurological: Negative for dizziness and weakness.  Psychiatric/Behavioral: Negative for agitation, behavioral problems and confusion.    Past Medical History:  Diagnosis Date  . Arthritis    "qwhere" (10/22/2016)  .  Cancer of right breast ([place]) ~ 2013  . Chronic back pain   . Chronic kidney disease, stage 3   . Depression   . Family history of adverse reaction to anesthesia    "my mother did; think her kidneys stopped working" (10/22/2016)  . Fibromyalgia   . Hypothyroidism   . Migraine    "last one was ~ 2014" (10/22/2016)  . Osteoporosis   . Recurrent UTI   . Sleep apnea    "don't wear a mask" (10/22/2016)   Past Surgical History:  Procedure Laterality Date  . APPENDECTOMY    . BREAST BIOPSY Right ~ 2013  . BREAST LUMPECTOMY Right ~ 2013  . CATARACT EXTRACTION W/ INTRAOCULAR LENS  IMPLANT, BILATERAL Bilateral   . CHOLECYSTECTOMY OPEN    . DILATION AND CURETTAGE OF UTERUS    . HIP FRACTURE SURGERY Bilateral   . JOINT REPLACEMENT    . LACERATION REPAIR  05/2016   "fell and hit my head; had a slight bleed"  . TONSILLECTOMY    . TOTAL KNEE ARTHROPLASTY Right    Social History:   reports that she quit smoking about 46 years ago. Her smoking use included Cigars and Cigarettes. She has a 20.00 pack-year smoking history. She has never used smokeless tobacco. She reports that she drinks about 1.8 oz of alcohol per week . She reports that she does not use drugs.  Family History  Problem Relation Age of Onset  . Chronic bronchitis Mother   . Liver cancer Father     Medications: Patient's Medications  New Prescriptions   No medications on file  Previous Medications   ACETAMINOPHEN (TYLENOL) 500 MG TABLET    Take 500 mg by mouth 3 (three) times daily.   BENZONATATE (TESSALON PERLES) 100 MG CAPSULE    Take 100 mg by mouth 3 (three) times daily as needed for cough.   BUPROPION (WELLBUTRIN XL) 300 MG 24 HR TABLET    Take 300 mg by mouth daily.   CALCIUM-MAGNESIUM-VITAMIN D (CALCIUM 1200+D3 PO)    Take 1 tablet by mouth 2 (two) times daily.   ESCITALOPRAM (LEXAPRO) 10 MG TABLET    Take 10 mg by mouth daily.   EXEMESTANE (AROMASIN) 25 MG TABLET    Take 25 mg by mouth every evening.   LEVOTHYROXINE  (SYNTHROID, LEVOTHROID) 137 MCG TABLET    Take 137 mcg by mouth daily before breakfast.   MULTIPLE VITAMIN (MULTIVITAMIN WITH MINERALS) TABS TABLET    Take 1 tablet by mouth daily.   NITROFURANTOIN (MACRODANTIN) 50 MG CAPSULE    Take 50 mg by mouth at bedtime.   OXYCODONE (OXY-IR) 5 MG CAPSULE    Take 5 mg by mouth every 8 (eight) hours as needed for pain.   POLYETHYLENE GLYCOL (MIRALAX / GLYCOLAX) PACKET    Take 17 g by mouth daily as needed for moderate constipation.   SENNA-DOCUSATE (SENOKOT-S) 8.6-50 MG TABLET    Take 1 tablet by mouth 2 (two) times daily.  Modified Medications   No medications on file  Discontinued Medications   OXYCODONE (OXY IR/ROXICODONE) 5 MG IMMEDIATE RELEASE TABLET    Take 1 tablet (5 mg total) by mouth every 4 (four) hours as needed for moderate pain or severe pain.     Physical Exam: Vitals:   12/04/16 1030  BP: 126/74  Pulse: 78  Resp: 19  Temp: 97.6 F (36.4 C)  SpO2: 95%  Weight: 159 lb 12.8 oz (72.5 kg)  Height: 5\' 7"  (1.702 m)    Physical Exam  Constitutional: She is oriented to person, place, and time. She appears well-developed and well-nourished. No distress.  HENT:  Head: Normocephalic and atraumatic.  Mouth/Throat: Oropharynx is clear and moist. No oropharyngeal exudate.  Eyes: Conjunctivae are normal. Pupils are equal, round, and reactive to light.  Neck: Normal range of motion. Neck supple.  Cardiovascular: Normal rate, regular rhythm and normal heart sounds.   Pulmonary/Chest: Effort normal and breath sounds normal.  Abdominal: Soft. Bowel sounds are normal.  Musculoskeletal: She exhibits no edema or tenderness.  Neurological: She is alert and oriented to person, place, and time.  Skin: Skin is warm and dry. She is not diaphoretic.  Psychiatric: She has a normal mood and affect.    Labs reviewed: Basic Metabolic Panel:  Recent Labs  10/22/16 0042 10/25/16 1458 10/26/16 0447  NA 141 139 142  K 3.6 4.2 4.6  CL 104 105 105    CO2 25 27 27   GLUCOSE 97 106* 85  BUN 20 17 19   CREATININE 1.11* 1.03* 0.98  CALCIUM 9.4 8.5* 9.1   Liver Function Tests: No results for input(s): AST, ALT, ALKPHOS, BILITOT, PROT, ALBUMIN in the last 8760 hours. No results for input(s): LIPASE, AMYLASE in the last 8760 hours. No results for input(s): AMMONIA in the last 8760 hours. CBC:  Recent Labs  10/22/16 0042 10/25/16 1458 10/26/16 0447 11/27/16  WBC 9.0 5.9 4.7 5.0  NEUTROABS 7.8*  --   --   --   HGB 12.9 10.2* 11.0* 12.2  HCT 39.5 32.0* 34.4* 38  MCV 101.0* 102.9* 103.6*  --  PLT 209 158 177 176   TSH: No results for input(s): TSH in the last 8760 hours. A1C: No results found for: HGBA1C Lipid Panel: No results for input(s): CHOL, HDL, LDLCALC, TRIG, CHOLHDL, LDLDIRECT in the last 8760 hours.  Radiological Exams: Dg Pelvis 1-2 Views  Result Date: 11/12/2016 CLINICAL DATA:  Follow-up left superior and inferior pubic rami fractures sustained at the time of a fall on 10/21/2016. Remote prior ORIF of bilateral femoral neck fractures. EXAM: PELVIS - 1-2 VIEW COMPARISON:  10/21/2016. FINDINGS: Interval expected partial healing of the fractures involving the left superior and inferior pubic rami, though complete healing has not yet occurred. No new/acute fractures. Symphysis pubis intact with mild degenerative changes. Sacroiliac joints intact. Osseous demineralization. Prior ORIF of bilateral femoral neck fractures with complete healing. IMPRESSION: Interval expected partial healing of the fractures involving the left superior and inferior pubic rami, though complete healing has not yet occurred. No new/acute osseous abnormality. Electronically Signed   By: Evangeline Dakin M.D.   On: 11/12/2016 16:31    Assessment/Plan 1. Closed fracture of single ramus of left pubis with routine healing, subsequent encounter Doing well with therapy, pain is well controlled on current regimen, no side effects noted from medication.   2.  Ataxic gait Has improve with therapy, conts to use walker. Will cont therapy upon discharge.   3. Vitamin D deficiency Vit D level of 23, to start vit d 50,000 units weekly x 4 weeks then to start Vit d 2000 units daily.   4. Osteoporosis, senile Per notes she has been on prolia in the past, Will need further evaluation and treatment of this by PCP when she is back in [place].   5. Other specified hypothyroidism TSH elevated, will increase synthroid to 137 mcg daily Spoke with daughter who plans to talk to PCP before changing dose, daughter is questioning if she was actually taking the 125 mcg dose prior to hospitalization   pt is stable for discharge-will need PT/OT per home health. DME needed includes bsc and tub seat, 3n1 Rx sent via epic to pharmacy except narcotic RX given for #30 tablets. .  will need to follow up with PCP once back home.     Carlos American. Harle Battiest  [facility] [phone] 8 am - 5 pm) [phone] (after hours)

## 2016-12-05 ENCOUNTER — Encounter: Payer: Self-pay | Admitting: Internal Medicine

## 2016-12-05 ENCOUNTER — Non-Acute Institutional Stay (SKILLED_NURSING_FACILITY): Payer: Medicare Other | Admitting: Internal Medicine

## 2016-12-05 DIAGNOSIS — E559 Vitamin D deficiency, unspecified: Secondary | ICD-10-CM

## 2016-12-05 DIAGNOSIS — R296 Repeated falls: Secondary | ICD-10-CM | POA: Diagnosis not present

## 2016-12-05 DIAGNOSIS — E039 Hypothyroidism, unspecified: Secondary | ICD-10-CM | POA: Diagnosis not present

## 2016-12-05 NOTE — Patient Instructions (Addendum)
Plan: See  Recommendations Total time 35  minutes; greater than 50% of the visit spent counseling patient and coordinating care for problems addressed at this encounter

## 2016-12-05 NOTE — Assessment & Plan Note (Addendum)
Vitamin D 50,000 international units weekly 4 then 2000 IU daily was ordered PCP can reevaluate the vitamin D level in 4-6 months

## 2016-12-05 NOTE — Progress Notes (Signed)
This is a nursing facility follow up for specific acute issue of abnormal TSH  Interim medical record and care since last [place] visit was updated with review of diagnostic studies and change in clinical status since last visit were documented.  HPI: TSH on 09/26/17 was 23.08. She had been on L thyroxine 125 g daily. Compliance is reported as good. On 10/03/17 L-thyroxine was increased to 137 g. Her daughter has requested a repeat TSH. Actually in the facility the patient has been on generic L-thyroxine, at home she was on branded Synthroid 125 g daily. Apparently this has been her dose for at least several months. The patient is unsure of her prior TSH levels. Gabapentin which was initiated for left pelvic area neuropathic type pain was discontinued due to concerns about sedation. She was on 100 mg every 8 hours as needed. Also low-dose amlodipine was discontinued as she did not not feel it was effective for the color changes of her feet suggesting Raynaud's phenomena.  Review of systems: Pertinent positives:  Reports dry skin.   She describes cold intolerance.  Reports weakness during ambulation.  Joint stiffness worse today than previous week, attributed to rainy weather.    Constitutional: No fever,significant weight change, fatigue  Eyes: No redness, discharge, pain, vision change ENT/mouth: No nasal congestion,  purulent discharge, earache,change in hearing ,sore throat  Cardiovascular: No chest pain, palpitations,paroxysmal nocturnal dyspnea, claudication, edema  Respiratory: No cough, sputum production,hemoptysis, DOE , significant snoring,apnea  Gastrointestinal: No heartburn,dysphagia,abdominal pain, nausea / vomiting,rectal bleeding, melena,change in bowels Genitourinary: No dysuria,hematuria, pyuria,  incontinence, nocturia Dermatologic: No rash, pruritus, change in appearance of skin Neurologic: No dizziness,headache,syncope, seizures, numbness ,  tingling Psychiatric: No significant anxiety , depression, insomnia, anorexia Endocrine: No change in hair/skin/ nails, excessive thirst, excessive hunger, excessive urination  Hematologic/lymphatic: No significant bruising, lymphadenopathy,abnormal bleeding Allergy/immunology: No itchy/ watery eyes, significant sneezing, urticaria, angioedema  Physical exam:  Pertinent or positive findings: Arcus senilis present. Bilateral ptosis.  Grade 1/2 systolic murmur.  Decreased pedal pulses.  Purplish discoloration in bilateral feet.  Cool to touch.  DJD Isolated changes of the hands.  General appearance:Adequately nourished; no acute distress , increased work of breathing is present.   Lymphatic: No lymphadenopathy about the head, neck, axilla . Eyes: No conjunctival inflammation or lid edema is present. There is no scleral icterus. Ears:  External ear exam shows no significant lesions or deformities.   Nose:  External nasal examination shows no deformity or inflammation. Nasal mucosa are pink and moist without lesions ,exudates Oral exam: lips and gums are healthy appearing.There is no oropharyngeal erythema or exudate . Neck:  No thyromegaly, masses, tenderness noted.    Heart:  Normal rate and regular rhythm. S1 and S2 normal without gallop,  click, rub .  Lungs:Chest clear to auscultation without wheezes, rhonchi,rales , rubs. Abdomen:Bowel sounds are normal. Abdomen is soft and nontender with no organomegaly, hernias,masses. GU: deferred  Extremities:  No cyanosis, clubbing  Neurologic exam : Strength equal  in upper & lower extremities Balance,Rhomberg,finger to nose testing could not be completed due to clinical state Skin: Warm & dry w/o tenting. No significant lesions or rash.  #1 hypothyroidism, see plan under the problem list #2 color changes in the feet suggesting venous insufficiency versus possible Raynaud's. She will discuss indications for amlodipine with her PCP upon return  to [place].  #3 radicular pelvic area pain, gabapentin discontinued because of concerns about possible sedation. Risk of recurrent falls with  the opioid discussed. At this time she plans to take Norco only qhs. If the neuropathic nature of her pain is felt to be clinically present as per her PCP, the gabapentin can be restarted and titrated slowly. #4 vitamin D deficiency. 50,000 IU weekly was ordered 4 then 2000 IU daily. Repeat vitamin D level in 4-6 months by PCP in [place].

## 2016-12-05 NOTE — Assessment & Plan Note (Signed)
12/05/16 increased risk of falls with the opioid discussed. She plans to take Norco only at bedtime, but will discuss this further with her PCP in [place]

## 2016-12-05 NOTE — Assessment & Plan Note (Signed)
09/26/17 TSH 23.08 on generic L-thyroxine 125 g daily at the SNF. L-thyroxine was increased to 137 g daily on 12/28. The pathophysiology of hypothyroidism was discussed with the patient & a diagram reviewed. It is recommended that she restart her branded Synthroid 125 g when she returns to her daughter's home and have her PCP repeat the TSH in 6-8 weeks.

## 2016-12-06 ENCOUNTER — Other Ambulatory Visit: Payer: Self-pay

## 2016-12-06 MED ORDER — LEVOTHYROXINE SODIUM 137 MCG PO TABS: 137.0000 ug | ORAL_TABLET | Freq: Every day | ORAL | 0 refills | Status: AC

## 2016-12-06 NOTE — Telephone Encounter (Signed)
Pt was discharged from [facility] on 12/04/16 and dosage of levothroxine was changed from 137 mcg to 150 mcg based on lab work showing TSH of 23.08.  When Janett Billow spoke with patient's daughter, she stated that dosage should remain at 169mcg until patient can be seen by PCP once back in [place].   New Rx was sent to pharmacy on file for levothroxine 118mcg, #30 no RF.

## 2016-12-07 DIAGNOSIS — M1990 Unspecified osteoarthritis, unspecified site: Secondary | ICD-10-CM | POA: Diagnosis not present

## 2016-12-07 DIAGNOSIS — S32509D Unspecified fracture of unspecified pubis, subsequent encounter for fracture with routine healing: Secondary | ICD-10-CM | POA: Diagnosis not present

## 2016-12-07 DIAGNOSIS — R1312 Dysphagia, oropharyngeal phase: Secondary | ICD-10-CM | POA: Diagnosis not present

## 2016-12-07 DIAGNOSIS — N183 Chronic kidney disease, stage 3 (moderate): Secondary | ICD-10-CM | POA: Diagnosis not present

## 2017-12-19 IMAGING — CT CT CERVICAL SPINE W/O CM
4 of 7 series · 13 of 33 positions shown, 14 images · non-contrast
Comparison: None.

CLINICAL DATA: Post unwitnessed fall today with headache.

EXAM:
CT HEAD WITHOUT CONTRAST
CT CERVICAL SPINE WITHOUT CONTRAST
TECHNIQUE: Multidetector CT imaging of the head and cervical spine was
performed following the standard protocol without intravenous
contrast. Multiplanar CT image reconstructions of the cervical spine
were also generated.

[Series 202: head w/o bone, idose (1) · axial · non-contrast · 0.49mm/px · z∈[+1056,+1141]mm · 3 of 68 slices shown]
[im 17/68  bone]
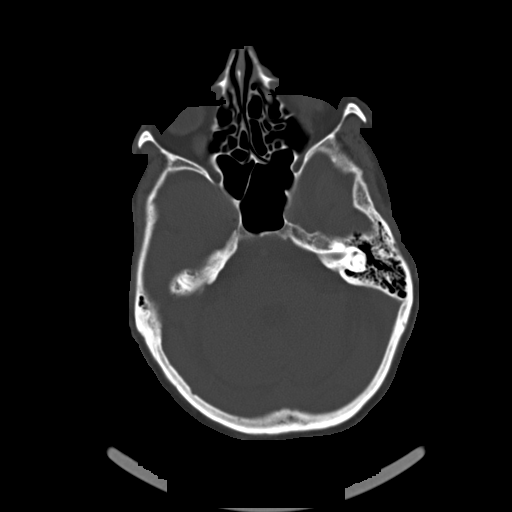
[im 34/68  bone]
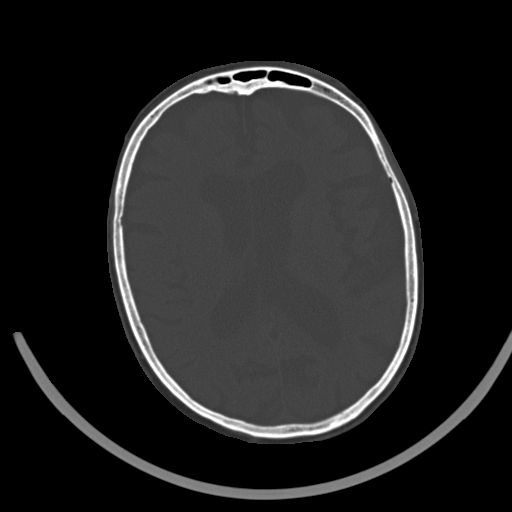
[im 51/68  bone]
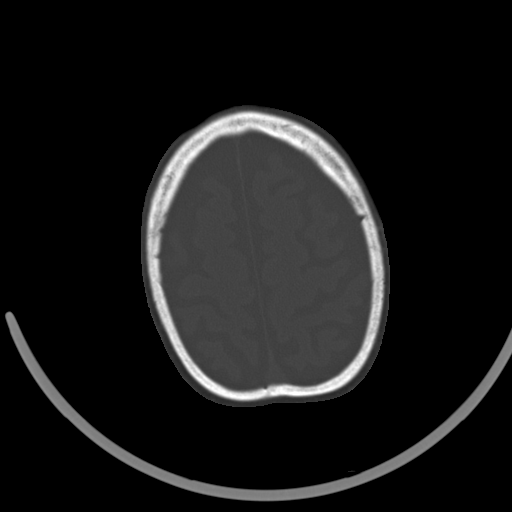

[Series 203: coronal st, idose (1) · coronal · 0.40mm/px · 1 of 81 slices shown]
[im 41/81  bone]
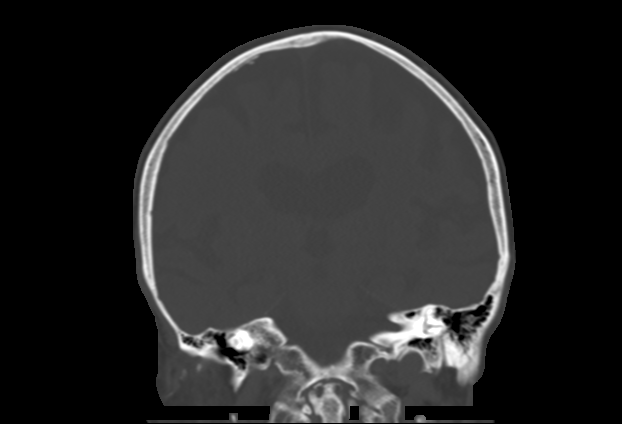

[Series 204: sagittal st, idose (1) · sagittal · 0.40mm/px · 5 of 82 slices shown]
[im 14/82  bone]
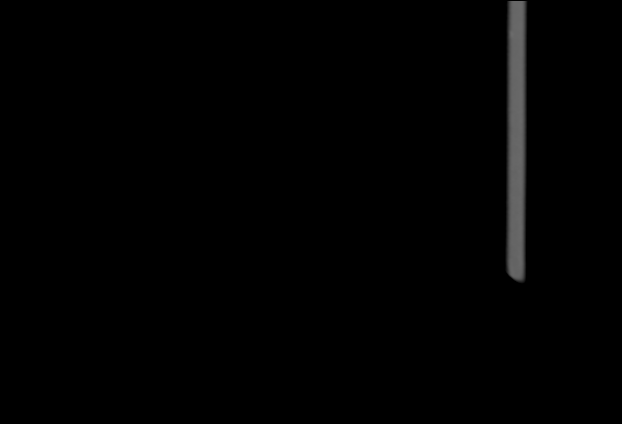
[im 28/82  bone]
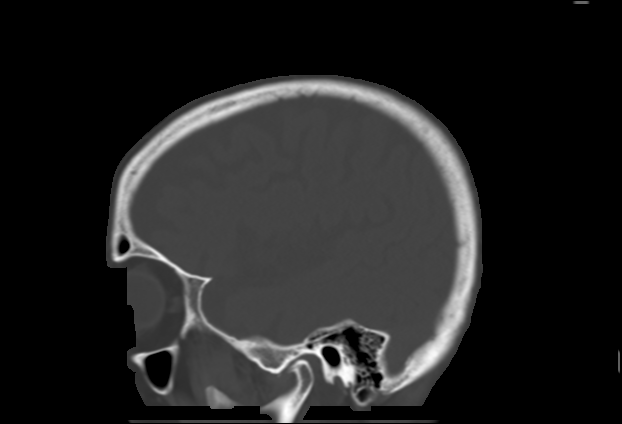
[im 41/82  bone]
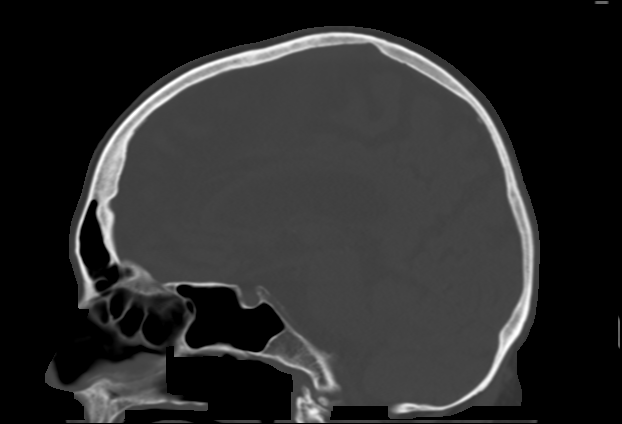
[im 55/82  bone]
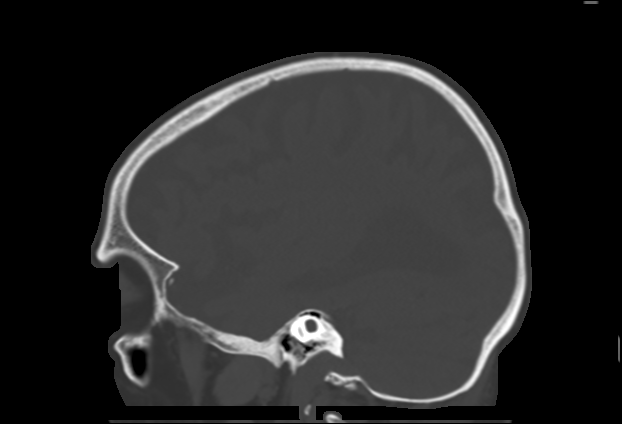
[im 68/82  bone]
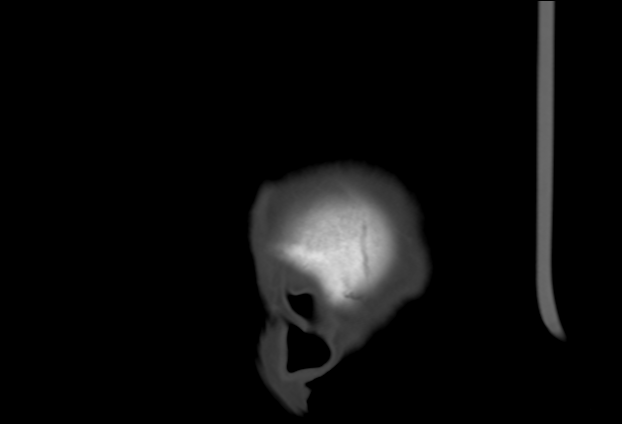

[Series 302: soft tissue, idose (2) · axial · 0.31mm/px · z∈[+931,+1033]mm · 4 of 85 slices shown, 5 images]
[im 17/85  soft-tissue]
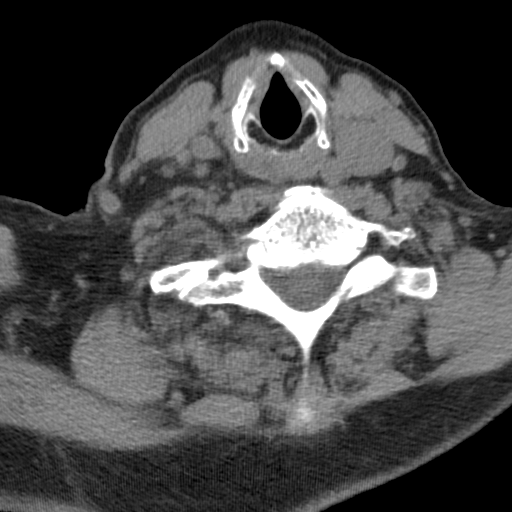
[im 17/85  bone]
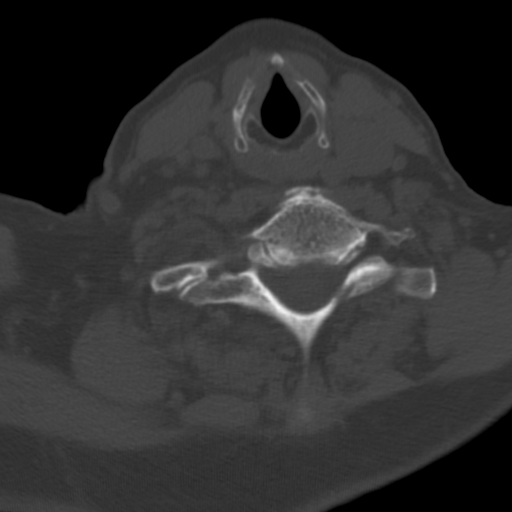
[im 34/85  bone]
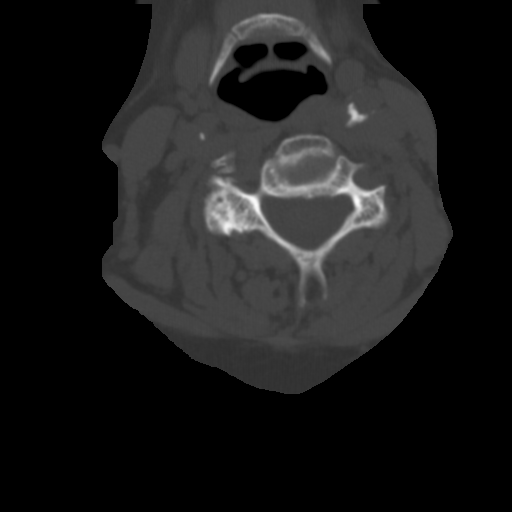
[im 51/85  bone]
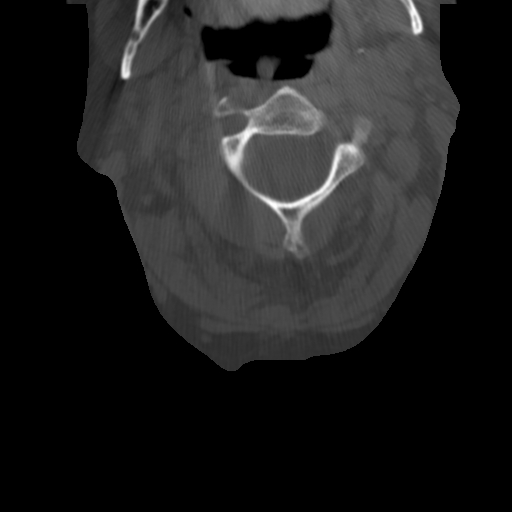
[im 68/85  bone]
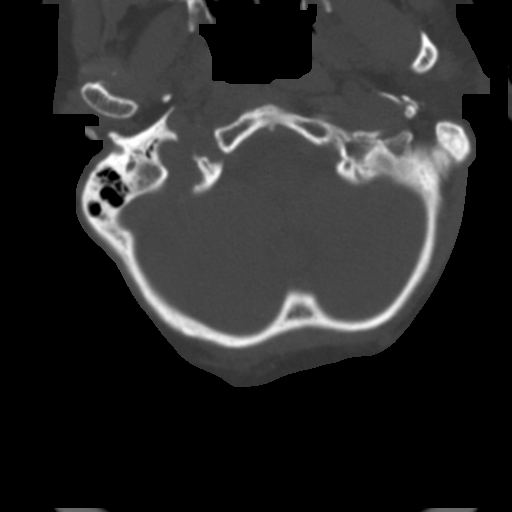

[13 of 33 positions shown; findings below may reference images not displayed]

FINDINGS: CT HEAD FINDINGS

Brain: No evidence of acute infarction, hemorrhage, hydrocephalus,
extra-axial collection or mass lesion/mass effect. Moderate
generalized atrophy. Mild chronic small vessel ischemia.

Vascular: No hyperdense vessel or unexpected calcification.

Skull: Normal. Negative for fracture or focal lesion.

Sinuses/Orbits: Bilateral cataract resection. Orbits are otherwise
unremarkable. Paranasal sinuses and mastoid air cells well-aerated.

Other: None.

CT CERVICAL SPINE FINDINGS

Alignment: Minimal degenerative-type anterolisthesis of C4 on C5.
Alignment is otherwise maintained.

Skull base and vertebrae: No acute fracture. No primary bone lesion
or focal pathologic process. Chronic degenerative change at C1-C2
articulation.

Soft tissues and spinal canal: No prevertebral fluid or swelling. No
visible canal hematoma.

Disc levels: Disc space narrowing and endplate spurring most
prominent at C5-C6 and C6-C7. There is multifocal facet arthropathy.
Multilevel neural foraminal stenosis.

Upper chest: No acute abnormality. Mild biapical pleuroparenchymal
scarring.

Other: Carotid vascular calcifications, left greater than right.
IMPRESSION: 1. No acute intracranial abnormality or skull fracture. Generalized
atrophy and chronic small vessel ischemia.
2. Degenerative change in the cervical spine without acute fracture
or subluxation.

## 2024-08-07 DEATH — deceased
